# Patient Record
Sex: Female | Born: 1990 | Race: White | Hispanic: No | Marital: Single | Smoking: Current every day smoker
Health system: Southern US, Community
[De-identification: ages and names within clinical notes are randomized; demographics above are authoritative.]

## PROBLEM LIST (undated history)

## (undated) ENCOUNTER — Inpatient Hospital Stay (HOSPITAL_COMMUNITY): Payer: Self-pay

## (undated) DIAGNOSIS — R87629 Unspecified abnormal cytological findings in specimens from vagina: Secondary | ICD-10-CM

## (undated) DIAGNOSIS — F1910 Other psychoactive substance abuse, uncomplicated: Secondary | ICD-10-CM

## (undated) DIAGNOSIS — F259 Schizoaffective disorder, unspecified: Secondary | ICD-10-CM

## (undated) DIAGNOSIS — M419 Scoliosis, unspecified: Secondary | ICD-10-CM

## (undated) DIAGNOSIS — A6000 Herpesviral infection of urogenital system, unspecified: Secondary | ICD-10-CM

## (undated) DIAGNOSIS — E079 Disorder of thyroid, unspecified: Secondary | ICD-10-CM

## (undated) DIAGNOSIS — R87619 Unspecified abnormal cytological findings in specimens from cervix uteri: Secondary | ICD-10-CM

## (undated) DIAGNOSIS — B999 Unspecified infectious disease: Secondary | ICD-10-CM

## (undated) DIAGNOSIS — F309 Manic episode, unspecified: Secondary | ICD-10-CM

## (undated) DIAGNOSIS — F32A Depression, unspecified: Secondary | ICD-10-CM

## (undated) DIAGNOSIS — F4310 Post-traumatic stress disorder, unspecified: Secondary | ICD-10-CM

## (undated) DIAGNOSIS — F329 Major depressive disorder, single episode, unspecified: Secondary | ICD-10-CM

## (undated) DIAGNOSIS — Z9109 Other allergy status, other than to drugs and biological substances: Secondary | ICD-10-CM

## (undated) DIAGNOSIS — F250 Schizoaffective disorder, bipolar type: Secondary | ICD-10-CM

## (undated) DIAGNOSIS — F319 Bipolar disorder, unspecified: Secondary | ICD-10-CM

## (undated) DIAGNOSIS — D649 Anemia, unspecified: Secondary | ICD-10-CM

## (undated) DIAGNOSIS — F419 Anxiety disorder, unspecified: Secondary | ICD-10-CM

## (undated) DIAGNOSIS — IMO0002 Reserved for concepts with insufficient information to code with codable children: Secondary | ICD-10-CM

## (undated) DIAGNOSIS — K900 Celiac disease: Secondary | ICD-10-CM

## (undated) HISTORY — PX: COLPOSCOPY: SHX161

## (undated) HISTORY — DX: Manic episode, unspecified: F30.9

## (undated) HISTORY — DX: Disorder of thyroid, unspecified: E07.9

## (undated) HISTORY — DX: Bipolar disorder, unspecified: F31.9

## (undated) HISTORY — DX: Herpesviral infection of urogenital system, unspecified: A60.00

## (undated) HISTORY — PX: NO PAST SURGERIES: SHX2092

## (undated) HISTORY — DX: Anxiety disorder, unspecified: F41.9

---
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
## 2010-09-12 ENCOUNTER — Inpatient Hospital Stay (HOSPITAL_COMMUNITY)
Admission: AD | Admit: 2010-09-12 | Discharge: 2010-09-12 | Disposition: A | Discharge status: Home / Self Care | Payer: Medicaid Other | Source: Ambulatory Visit | Attending: Obstetrics and Gynecology | Admitting: Obstetrics and Gynecology

## 2010-09-12 DIAGNOSIS — O479 False labor, unspecified: Secondary | ICD-10-CM | POA: Insufficient documentation

## 2010-09-24 ENCOUNTER — Inpatient Hospital Stay (HOSPITAL_COMMUNITY)
Admission: AD | Admit: 2010-09-24 | Discharge: 2010-09-27 | DRG: 775 | Disposition: A | Discharge status: Home / Self Care | Payer: 59 | Source: Ambulatory Visit | Attending: Obstetrics and Gynecology | Admitting: Obstetrics and Gynecology

## 2010-09-24 DIAGNOSIS — O99334 Smoking (tobacco) complicating childbirth: Secondary | ICD-10-CM | POA: Diagnosis present

## 2010-09-25 ENCOUNTER — Inpatient Hospital Stay (HOSPITAL_COMMUNITY): Admission: AD | Admit: 2010-09-25 | Payer: Self-pay | Admitting: Obstetrics and Gynecology

## 2010-09-25 LAB — CBC
HCT: 36.4 % (ref 36.0–46.0)
MCH: 28 pg (ref 26.0–34.0)
MCV: 85.6 fL (ref 78.0–100.0)
RBC: 4.25 MIL/uL (ref 3.87–5.11)
RDW: 13.4 % (ref 11.5–15.5)
WBC: 13.5 10*3/uL — ABNORMAL HIGH (ref 4.0–10.5)

## 2010-09-26 LAB — CBC
HCT: 32.1 % — ABNORMAL LOW (ref 36.0–46.0)
MCH: 27.8 pg (ref 26.0–34.0)
MCV: 86.5 fL (ref 78.0–100.0)
RBC: 3.71 MIL/uL — ABNORMAL LOW (ref 3.87–5.11)
WBC: 18.3 10*3/uL — ABNORMAL HIGH (ref 4.0–10.5)

## 2010-10-17 NOTE — Discharge Summary (Signed)
  Monica, Patel                 ACCOUNT NO.:  [id number]  MEDICAL RECORD NO.:  [id number]           PATIENT TYPE:  I  LOCATION:  9107                          FACILITY:  [facility]  PHYSICIAN:  Zenaida Niece, M.D.DATE OF BIRTH:  [birth year]  DATE OF ADMISSION:  09/24/2010 DATE OF DISCHARGE:  09/27/2010                              DISCHARGE SUMMARY   ADMISSION DIAGNOSIS:  Intrauterine pregnancy at 39 weeks with ruptured membranes.  DISCHARGE DIAGNOSIS:  Intrauterine pregnancy at 39 weeks with ruptured membranes.  PROCEDURES:  On March 1, Dr. Ambrose Mantle performed a spontaneous vaginal delivery.  HISTORY AND PHYSICAL:  This is a 20 year old gravida 1, para 0 with an EGA of [redacted] weeks who presents with a complaint of possible leaking. Prenatal care essentially uncomplicated.  Significant prenatal labs are blood type is O positive with a negative antibody screen, rubella immune, first trimester screen normal, AFP is normal, GCT is 156, and GTT is normal.  Group B strep is negative.  Past medical history is significant for depression and scoliosis.  PHYSICAL EXAMINATION:  VITAL SIGNS:  She is afebrile with stable vital signs.  Fetal heart tracing reactive. ABDOMEN:  Gravid, nontender.  Cervix is 1+, 50, -2, vertex presentation, and she was eventually confirmed to have ruptured membranes after walking.  HOSPITAL COURSE:  The patient was admitted and put on Pitocin.  She eventually progressed into active labor, reached complete, pushed well, and on March 1 Dr. Ambrose Mantle performed spontaneous vaginal delivery of a viable female infant with Apgars of 9 and 9, weight 8 pounds 5 ounces. Placenta delivered spontaneously intact, uterus palpated normal, second- degree laceration repaired with 3-0 Vicryl, and estimated blood loss was 400 mL.  Postpartum, she had no significant complications.  Pre-delivery hemoglobin 11.9, post-delivery 10.3.  On postpartum day #2, she was felt to be stable enough  for discharge home.  DISCHARGE INSTRUCTIONS:  Regular diet, pelvic rest, follow up in 6 weeks.  MEDICATIONS:  Percocet #20 one to two p.o. q.4-6 h. p.r.n. pain and over- the-counter ibuprofen as needed.  She is given our discharge pamphlet.     Zenaida Niece, M.D.     TDM/MEDQ  D:  09/27/2010  T:  09/28/2010  Job:  [id number]  Electronically Signed by Lavina Hamman M.D. on 10/17/2010 08:52:59 AM

## 2011-04-17 LAB — DIFFERENTIAL
Basophils Relative: 0
Eosinophils Absolute: 0.1
Eosinophils Relative: 4
Monocytes Absolute: 0.3
Monocytes Relative: 10

## 2011-04-17 LAB — CBC
HCT: 37.5
Hemoglobin: 12.8
MCHC: 34.1
MCV: 83
RBC: 4.52
RDW: 13.3

## 2012-06-03 ENCOUNTER — Encounter (HOSPITAL_COMMUNITY): Payer: Self-pay | Admitting: *Deleted

## 2012-06-03 ENCOUNTER — Inpatient Hospital Stay (HOSPITAL_COMMUNITY): Payer: 59

## 2012-06-03 ENCOUNTER — Inpatient Hospital Stay (HOSPITAL_COMMUNITY)
Admission: AD | Admit: 2012-06-03 | Discharge: 2012-06-03 | Disposition: A | Discharge status: Home / Self Care | Payer: 59 | Source: Ambulatory Visit | Attending: Obstetrics and Gynecology | Admitting: Obstetrics and Gynecology

## 2012-06-03 DIAGNOSIS — O034 Incomplete spontaneous abortion without complication: Secondary | ICD-10-CM | POA: Insufficient documentation

## 2012-06-03 DIAGNOSIS — O039 Complete or unspecified spontaneous abortion without complication: Secondary | ICD-10-CM

## 2012-06-03 HISTORY — DX: Unspecified abnormal cytological findings in specimens from cervix uteri: R87.619

## 2012-06-03 HISTORY — DX: Reserved for concepts with insufficient information to code with codable children: IMO0002

## 2012-06-03 HISTORY — DX: Depression, unspecified: F32.A

## 2012-06-03 HISTORY — DX: Anemia, unspecified: D64.9

## 2012-06-03 HISTORY — DX: Unspecified infectious disease: B99.9

## 2012-06-03 HISTORY — DX: Major depressive disorder, single episode, unspecified: F32.9

## 2012-06-03 LAB — CBC
Hemoglobin: 11.6 g/dL — ABNORMAL LOW (ref 12.0–15.0)
MCH: 28.6 pg (ref 26.0–34.0)
MCHC: 33.7 g/dL (ref 30.0–36.0)
Platelets: 193 10*3/uL (ref 150–400)
RDW: 12.8 % (ref 11.5–15.5)

## 2012-06-03 MED ORDER — KETOROLAC TROMETHAMINE 60 MG/2ML IM SOLN
60.0000 mg | Freq: Once | INTRAMUSCULAR | Status: AC
  Administered 2012-06-03: 60 mg via INTRAMUSCULAR
  Filled 2012-06-03: qty 2

## 2012-06-03 NOTE — MAU Provider Note (Signed)
History     CSN: [id number]  Arrival date and time: 06/03/12 1426   First Provider Initiated Contact with Patient 06/03/12 1520      No chief complaint on file.  HPI  Pt dx with failed pregnancy and given Cytotec.  Reports bleeding started 5 days later on a Saturday.  Bleeding has continued, using approximately 4-5 pads a day.  Also reports midpelvic pain not relieved with Vicodin.  State can hardly stand or walk due to pain.     Past Medical History  Diagnosis Date  . Anemia   . Infection     UTI  . Abnormal Pap smear     colpo  . Depression     Past Surgical History  Procedure Date  . No past surgeries     Family History  Problem Relation Age of Onset  . Other Neg Hx     History  Substance Use Topics  . Smoking status: Current Every Day Smoker -- 0.5 packs/day for 6 years    Types: Cigarettes  . Smokeless tobacco: Never Used  . Alcohol Use: Yes     Comment: rare, not recently    Allergies: No Known Allergies  Prescriptions prior to admission  Medication Sig Dispense Refill  . HYDROcodone-acetaminophen (VICODIN) 5-500 MG per tablet Take 1 tablet by mouth every 6 (six) hours as needed. pain      . ibuprofen (ADVIL,MOTRIN) 800 MG tablet Take 800 mg by mouth every 8 (eight) hours as needed. pain        Review of Systems  Gastrointestinal: Positive for abdominal pain.  Genitourinary:       Vaginal pain   Physical Exam   Blood pressure 109/58, pulse 116, temperature 98.2 F (36.8 C), temperature source Oral, resp. rate 20, height 5\' 6"  (1.676 m), weight 53.978 kg (119 lb), last menstrual period 02/21/2012, unknown if currently breastfeeding.  Physical Exam  Constitutional: She is oriented to person, place, and time. She appears well-developed and well-nourished. No distress.       Uncomfortable appearing  HENT:  Head: Normocephalic.  Neck: Normal range of motion. Neck supple.  Cardiovascular: Normal rate, regular rhythm and normal heart sounds.  Exam  reveals no gallop and no friction rub.   No murmur heard. Respiratory: Effort normal and breath sounds normal. No respiratory distress.  GI: Soft. She exhibits no mass. There is tenderness (suprapubic). There is no rebound and no guarding.  Genitourinary: There is bleeding (moderate; +clots) around the vagina.  Neurological: She is alert and oriented to person, place, and time.  Skin: Skin is warm and dry.    MAU Course  Procedures  Results for orders placed during the hospital encounter of 06/03/12 (from the past 24 hour(s))  CBC     Status: Abnormal   Collection Time   06/03/12  3:33 PM      Component Value Range   WBC 13.6 (*) 4.0 - 10.5 K/uL   RBC 4.06  3.87 - 5.11 MIL/uL   Hemoglobin 11.6 (*) 12.0 - 15.0 g/dL   HCT 56.2 (*) 13.0 - 86.5 %   MCV 84.7  78.0 - 100.0 fL   MCH 28.6  26.0 - 34.0 pg   MCHC 33.7  30.0 - 36.0 g/dL   RDW 78.4  69.6 - 29.5 %   Platelets 193  150 - 400 K/uL     Reports improvement with pain with Toradol.  Consulted with Dr. Ambrose Mantle > reviewed HPI/exam/labs and ultrasound > DC home  with bleeding precautions.   Assessment and Plan  Vaginal Bleeding - Incomplete Miscarriage  Plan: DC to home. Bleeding Precautions.  [facility] 06/03/2012, 3:21 PM

## 2012-06-03 NOTE — MAU Note (Signed)
Was dx with failed preg, was given pills, has miscarriage early Sunday morning. Pain is worse today than it has been. Has tried vicodin, did not help.  Can hardly stand or walk due to pain.

## 2012-06-04 ENCOUNTER — Inpatient Hospital Stay (HOSPITAL_COMMUNITY): Payer: 59 | Admitting: Anesthesiology

## 2012-06-04 ENCOUNTER — Encounter (HOSPITAL_COMMUNITY): Payer: Self-pay | Admitting: Anesthesiology

## 2012-06-04 ENCOUNTER — Ambulatory Visit (HOSPITAL_COMMUNITY)
Admission: AD | Admit: 2012-06-04 | Discharge: 2012-06-04 | Disposition: A | Discharge status: Home / Self Care | Payer: 59 | Source: Ambulatory Visit | Attending: Obstetrics and Gynecology | Admitting: Obstetrics and Gynecology

## 2012-06-04 ENCOUNTER — Ambulatory Visit: Admit: 2012-06-04 | Payer: Self-pay | Admitting: Obstetrics and Gynecology

## 2012-06-04 ENCOUNTER — Encounter (HOSPITAL_COMMUNITY): Payer: Self-pay | Admitting: Obstetrics and Gynecology

## 2012-06-04 ENCOUNTER — Encounter (HOSPITAL_COMMUNITY)
Admission: AD | Disposition: A | Discharge status: Home / Self Care | Payer: Self-pay | Source: Ambulatory Visit | Attending: Obstetrics and Gynecology

## 2012-06-04 DIAGNOSIS — I951 Orthostatic hypotension: Secondary | ICD-10-CM | POA: Insufficient documentation

## 2012-06-04 DIAGNOSIS — O031 Delayed or excessive hemorrhage following incomplete spontaneous abortion: Secondary | ICD-10-CM | POA: Insufficient documentation

## 2012-06-04 HISTORY — PX: DILATION AND EVACUATION: SHX1459

## 2012-06-04 LAB — TYPE AND SCREEN: ABO/RH(D): O POS

## 2012-06-04 LAB — CBC
MCV: 84.6 fL (ref 78.0–100.0)
Platelets: 202 10*3/uL (ref 150–400)
RDW: 12.7 % (ref 11.5–15.5)
WBC: 16 10*3/uL — ABNORMAL HIGH (ref 4.0–10.5)

## 2012-06-04 SURGERY — DILATION AND EVACUATION, UTERUS
Anesthesia: General | Site: Vagina | Wound class: Clean Contaminated

## 2012-06-04 MED ORDER — MIDAZOLAM HCL 5 MG/5ML IJ SOLN
INTRAMUSCULAR | Status: DC | PRN
  Administered 2012-06-04: 1 mg via INTRAVENOUS

## 2012-06-04 MED ORDER — MIDAZOLAM HCL 2 MG/2ML IJ SOLN: 0.5000 mg | Freq: Once | INTRAMUSCULAR | Status: DC | PRN

## 2012-06-04 MED ORDER — LIDOCAINE HCL 1 % IJ SOLN: INTRAMUSCULAR | Status: DC | PRN

## 2012-06-04 MED ORDER — KETOROLAC TROMETHAMINE 30 MG/ML IJ SOLN
INTRAMUSCULAR | Status: DC | PRN
  Administered 2012-06-04: 30 mg via INTRAVENOUS

## 2012-06-04 MED ORDER — LACTATED RINGERS IV SOLN
INTRAVENOUS | Status: DC
  Administered 2012-06-04: 1000 mL via INTRAVENOUS
  Administered 2012-06-04: 250 mL/h via INTRAVENOUS

## 2012-06-04 MED ORDER — SODIUM CHLORIDE 0.9 % IR SOLN
Status: DC | PRN
  Administered 2012-06-04: 1000 mL

## 2012-06-04 MED ORDER — PHENYLEPHRINE HCL 10 MG/ML IJ SOLN
INTRAMUSCULAR | Status: DC | PRN
  Administered 2012-06-04: 0.1 mg via INTRAVENOUS
  Administered 2012-06-04: .04 mg via INTRAVENOUS
  Administered 2012-06-04: 0.2 mg via INTRAVENOUS
  Administered 2012-06-04 (×2): 0.1 mg via INTRAVENOUS
  Administered 2012-06-04: .08 mg via INTRAVENOUS
  Administered 2012-06-04: 0.1 mg via INTRAVENOUS

## 2012-06-04 MED ORDER — CITRIC ACID-SODIUM CITRATE 334-500 MG/5ML PO SOLN
ORAL | Status: AC
  Filled 2012-06-04: qty 15

## 2012-06-04 MED ORDER — KETOROLAC TROMETHAMINE 30 MG/ML IJ SOLN: 15.0000 mg | Freq: Once | INTRAMUSCULAR | Status: DC | PRN

## 2012-06-04 MED ORDER — FENTANYL CITRATE 0.05 MG/ML IJ SOLN
INTRAMUSCULAR | Status: AC
  Filled 2012-06-04: qty 2

## 2012-06-04 MED ORDER — MEPERIDINE HCL 25 MG/ML IJ SOLN: 6.2500 mg | INTRAMUSCULAR | Status: DC | PRN

## 2012-06-04 MED ORDER — PROMETHAZINE HCL 25 MG/ML IJ SOLN: 6.2500 mg | INTRAMUSCULAR | Status: DC | PRN

## 2012-06-04 MED ORDER — DEXAMETHASONE SODIUM PHOSPHATE 4 MG/ML IJ SOLN
INTRAMUSCULAR | Status: DC | PRN
  Administered 2012-06-04: 5 mg via INTRAVENOUS

## 2012-06-04 MED ORDER — LACTATED RINGERS IV SOLN
INTRAVENOUS | Status: DC | PRN
  Administered 2012-06-04 (×2): via INTRAVENOUS

## 2012-06-04 MED ORDER — OXYTOCIN 10 UNIT/ML IJ SOLN
40.0000 [IU] | INTRAVENOUS | Status: DC | PRN
  Administered 2012-06-04: 20 [IU] via INTRAVENOUS

## 2012-06-04 MED ORDER — FENTANYL CITRATE 0.05 MG/ML IJ SOLN
25.0000 ug | INTRAMUSCULAR | Status: DC | PRN
  Administered 2012-06-04: 25 ug via INTRAVENOUS

## 2012-06-04 MED ORDER — CEFAZOLIN SODIUM 1-5 GM-% IV SOLN
1.0000 g | Freq: Three times a day (TID) | INTRAVENOUS | Status: DC
  Filled 2012-06-04 (×4): qty 50

## 2012-06-04 MED ORDER — BUPIVACAINE HCL (PF) 0.75 % IJ SOLN
INTRAMUSCULAR | Status: DC | PRN
  Administered 2012-06-04: 1.5 mL

## 2012-06-04 MED ORDER — CEFAZOLIN SODIUM 1-5 GM-% IV SOLN
INTRAVENOUS | Status: DC | PRN
  Administered 2012-06-04: 1 g via INTRAVENOUS

## 2012-06-04 SURGICAL SUPPLY — 20 items
CATH ROBINSON RED A/P 16FR (CATHETERS) ×2 IMPLANT
CLOTH BEACON ORANGE TIMEOUT ST (SAFETY) ×2 IMPLANT
DECANTER SPIKE VIAL GLASS SM (MISCELLANEOUS) ×2 IMPLANT
GLOVE BIO SURGEON STRL SZ7.5 (GLOVE) ×4 IMPLANT
GOWN PREVENTION PLUS XLARGE (GOWN DISPOSABLE) ×2 IMPLANT
GOWN STRL REIN XL XLG (GOWN DISPOSABLE) ×2 IMPLANT
KIT BERKELEY 1ST TRIMESTER 3/8 (MISCELLANEOUS) ×2 IMPLANT
NEEDLE SPNL 22GX3.5 QUINCKE BK (NEEDLE) ×2 IMPLANT
NS IRRIG 1000ML POUR BTL (IV SOLUTION) ×2 IMPLANT
PACK VAGINAL MINOR WOMEN LF (CUSTOM PROCEDURE TRAY) ×2 IMPLANT
PAD OB MATERNITY 4.3X12.25 (PERSONAL CARE ITEMS) ×2 IMPLANT
PAD PREP 24X48 CUFFED NSTRL (MISCELLANEOUS) ×2 IMPLANT
SET BERKELEY SUCTION TUBING (SUCTIONS) ×2 IMPLANT
SUT CHROMIC 2 0 CT 1 (SUTURE) ×2 IMPLANT
SYR CONTROL 10ML LL (SYRINGE) ×2 IMPLANT
TOWEL OR 17X24 6PK STRL BLUE (TOWEL DISPOSABLE) ×4 IMPLANT
VACURETTE 10 RIGID CVD (CANNULA) IMPLANT
VACURETTE 7MM CVD STRL WRAP (CANNULA) IMPLANT
VACURETTE 8 RIGID CVD (CANNULA) ×2 IMPLANT
VACURETTE 9 RIGID CVD (CANNULA) IMPLANT

## 2012-06-04 NOTE — Anesthesia Preprocedure Evaluation (Signed)
Anesthesia Evaluation  Patient identified by MRN, date of birth, ID band Patient awake    Reviewed: Allergy & Precautions, H&P , Patient's Chart, lab work & pertinent test results, reviewed documented beta blocker date and time   History of Anesthesia Complications Negative for: history of anesthetic complications  Airway Mallampati: II TM Distance: >3 FB Neck ROM: full    Dental No notable dental hx.    Pulmonary neg pulmonary ROS,  breath sounds clear to auscultation  Pulmonary exam normal       Cardiovascular Exercise Tolerance: Good negative cardio ROS  Rhythm:regular Rate:Normal     Neuro/Psych PSYCHIATRIC DISORDERS Depression negative neurological ROS  negative psych ROS   GI/Hepatic negative GI ROS, Neg liver ROS,   Endo/Other  negative endocrine ROS  Renal/GU negative Renal ROS     Musculoskeletal   Abdominal   Peds  Hematology negative hematology ROS (+)   Anesthesia Other Findings Orthostatic bleeding  Reproductive/Obstetrics negative OB ROS                           Anesthesia Physical Anesthesia Plan  ASA: II and emergent  Anesthesia Plan: General ETT   Post-op Pain Management:    Induction: Cricoid pressure planned, Rapid sequence and Intravenous  Airway Management Planned:   Additional Equipment:   Intra-op Plan:   Post-operative Plan:   Informed Consent: I have reviewed the patients History and Physical, chart, labs and discussed the procedure including the risks, benefits and alternatives for the proposed anesthesia with the patient or authorized representative who has indicated his/her understanding and acceptance.   Dental Advisory Given  Plan Discussed with: CRNA and Surgeon  Anesthesia Plan Comments:         Anesthesia Quick Evaluation

## 2012-06-04 NOTE — OR Nursing (Signed)
No lidocaine was used during the surgery.

## 2012-06-04 NOTE — H&P (Signed)
[name], [name] NO.:  [id number]  MEDICAL RECORD NO.:  [id number]  LOCATION:  OZ30                          FACILITY:  [facility]  PHYSICIAN:  Malachi Pro. Ambrose Mantle, M.D. DATE OF BIRTH:  [birth year]  DATE OF ADMISSION:  06/04/2012 DATE OF DISCHARGE:                             HISTORY & PHYSICAL   HISTORY OF PRESENT ILLNESS:  This is a 21 year old white single female para 1-0-0-1, gravida 2, last menstrual period February 21, 2012; however, ultrasound on May 25, 2012, showed an 8 week 5 day nonviable embryo. The patient was given Cytotec on May 28, 2012, and passed an embryo on May 29, 2012.  She did fine until June 03, 2012, when she began having a lot of abdominal pain and came to the emergency room at [facility], underwent an ultrasound that showed the embryo was gone, but there was some possible material in the uterus.  It was not clear that there was retained products.  She was discharged after some Toradol.  She came back to the hospital tonight complaining of severe pain and came by EMS.  Her temperature was 98.7, pulse is 108, respirations 18, blood pressure 103/58; however, when she sat up, her pulse went to 175, an her blood pressure dropped to 78/39.  Her hemoglobin, which had been 11.6 yesterday had dropped to 10.4.  The patient states that she has been bleeding heavily the last few hours and passing clots as large as a couple of silver dollars.  PAST MEDICAL HISTORY:  Reveals no known allergies.  No operations. Usual childhood diseases.  The patient does have a 76-month-old child. She does not drink alcohol or take drugs, but she does smoke a 1/2 pack of cigarettes a day.  FAMILY HISTORY:  Her mother is 54, living and well.  Father 95, living and well.  Two sisters living and well.  PHYSICAL EXAMINATION:  VITAL SIGNS:  Temperature 98.7, pulse 108, respirations 18, blood pressure 103/58. HEART:  Normal size and sounds.  No  murmurs. LUNGS:  Clear to auscultation. ABDOMEN:  Has definite increased tone in the lower abdomen.  It is impossible to tell whether it is actual structure in the lower abdomen that is above the pubis.  The patient cannot relax well.  On visual examination of the vulva, there is a blood clot present in the vagina that I removed with my finger.  The patient does not tolerate an exam well, but I cannot tell that the cervix is open.  ADMITTING IMPRESSION:  Probable incomplete abortion with hemorrhage and anemia with resulting postural hypotension.  The patient is admitted for suction D and C.  She understands the risks.  She has been given risks of perforation of the uterus, hemorrhage, need for operation in the abdomen, possible blood transfusion, and infection.  She understands and agrees to proceed.     Malachi Pro. Ambrose Mantle, M.D.     TFH/MEDQ  D:  06/04/2012  T:  06/04/2012  Job:  [id number]

## 2012-06-04 NOTE — Op Note (Signed)
Operative note on Monica Patel:  Date of the operation: 06/04/2012  Preoperative diagnosis: Incomplete abortion with hemorrhage resulting in postural hypotension  Postoperative diagnosis: Same  Operation: Suction D&C  Operator: Ambrose Mantle  Anesthesia: Dr. Brayton Caves spinal  The patient was brought to the operating room and given a spinal anesthetic by Dr. Brayton Caves. She was placed in the lithotomy position. The vulva, vagina, and urethra were prepped with Betadine solution and the bladder was emptied with a [place] catheter. A time out was done. A weighted speculum was placed posteriorly in the vagina and I removed about 150 cc of formed clot from the vagina. A large amount of placental tissue that I thought measured 3 x 3 x 2" was removed from the vagina above the clot. The cervix was then grasped with a tenaculum on the anterior lip and ring forceps were placed into the uterus to remove any free tissue. No dilatation of the cervix was required. I inserted a #8 curved suction curet and curetted the endometrial cavity. After I got no return on the suction I used a sharp curet to ensure that the walls of the uterus felt smooth. I then removed the tenaculum and when pressure did not stop the bleeding from one of the sites I sutured it with 2-0 chromic. There was minimal blood loss during the procedure. All of her blood loss had been prior to coming to the hospital and what was removed from the vagina in the operating room. The patient was returned to recovery in satisfactory condition. Prior to leaving the operating room I massaged the uterus and it was quite firm and only upper limit of normal size

## 2012-06-04 NOTE — Anesthesia Procedure Notes (Signed)
Spinal  Patient location during procedure: OR Start time: 06/04/2012 5:59 PM Staffing Anesthesiologist: Brayton Caves R Performed by: anesthesiologist  Preanesthetic Checklist Completed: patient identified, site marked, surgical consent, pre-op evaluation, timeout performed, IV checked, risks and benefits discussed and monitors and equipment checked Spinal Block Patient position: sitting Prep: DuraPrep Patient monitoring: heart rate, cardiac monitor, continuous pulse ox and blood pressure Approach: midline Location: L3-4 Injection technique: single-shot Needle Needle type: Sprotte  Needle gauge: 24 G Needle length: 9 cm Assessment Sensory level: T4 Additional Notes Patient identified.  Risk benefits discussed including failed block, incomplete pain control, headache, nerve damage, paralysis, blood pressure changes, nausea, vomiting, reactions to medication both toxic or allergic, and postpartum back pain.  Patient expressed understanding and wished to proceed.  All questions were answered.  Sterile technique used throughout procedure.  CSF was clear.  No parasthesia or other complications.  Please see nursing notes for vital signs.

## 2012-06-04 NOTE — Transfer of Care (Signed)
Immediate Anesthesia Transfer of Care Note  Patient: Monica Patel  Procedure(s) Performed: Procedure(s) (LRB) with comments: DILATATION AND EVACUATION (N/A)  Patient Location: PACU  Anesthesia Type:Spinal  Level of Consciousness: awake, alert  and oriented  Airway & Oxygen Therapy: Patient Spontanous Breathing  Post-op Assessment: Report given to PACU RN and Post -op Vital signs reviewed and stable  Post vital signs: stable  Complications: No apparent anesthesia complications

## 2012-06-04 NOTE — Progress Notes (Signed)
Pt arrival by EMS.  EMS bedside report of Dilaudid 1 mg in route for pain of 10/10 per v.o. From Lilyan Punt, NP.

## 2012-06-04 NOTE — MAU Provider Note (Signed)
EMS called on the way to MAU.  Client in pain.  Dilaudid 1 mg IV ordered. Client arrived in MAU.  States pain relieved for a short time, but was now cramping again. On arrival to MAU, BP 101/62, pulse 110.  Sitting, BP 78/39 and pulse 175.  Client was very pale and returned to lying down position. Then BP 103/58 and pulse 108. Dr. Ambrose Mantle notified of client's condition and will come to see patient.

## 2012-06-04 NOTE — MAU Note (Signed)
"  I had a miscarriage on Sunday after taking Cytotec.  I started bleeding & severe cramping about 12:30pm today.  I have soaked 4 pads in the last 2-3 hours."

## 2012-06-04 NOTE — Anesthesia Postprocedure Evaluation (Signed)
Anesthesia Post Note  Patient: Monica Patel  Procedure(s) Performed: Procedure(s) (LRB): DILATATION AND EVACUATION (N/A)  Anesthesia type: Spinal  Patient location: PACU  Post pain: Pain level controlled  Post assessment: Post-op Vital signs reviewed  Last Vitals:  Filed Vitals:   06/04/12 2015  BP: 101/55  Pulse: 97  Temp: 36.1 C  Resp: 24    Post vital signs: Reviewed  Level of consciousness: awake  Complications: No apparent anesthesia complications

## 2012-06-06 ENCOUNTER — Encounter (HOSPITAL_COMMUNITY): Payer: Self-pay | Admitting: Obstetrics and Gynecology

## 2013-01-17 ENCOUNTER — Encounter (HOSPITAL_COMMUNITY): Payer: Self-pay | Admitting: Emergency Medicine

## 2013-01-17 ENCOUNTER — Emergency Department (HOSPITAL_COMMUNITY)
Admission: EM | Admit: 2013-01-17 | Discharge: 2013-01-18 | Disposition: A | Discharge status: Home / Self Care | Payer: 59 | Attending: Emergency Medicine | Admitting: Emergency Medicine

## 2013-01-17 DIAGNOSIS — F17200 Nicotine dependence, unspecified, uncomplicated: Secondary | ICD-10-CM | POA: Insufficient documentation

## 2013-01-17 DIAGNOSIS — Y92009 Unspecified place in unspecified non-institutional (private) residence as the place of occurrence of the external cause: Secondary | ICD-10-CM | POA: Insufficient documentation

## 2013-01-17 DIAGNOSIS — Z8659 Personal history of other mental and behavioral disorders: Secondary | ICD-10-CM | POA: Insufficient documentation

## 2013-01-17 DIAGNOSIS — T6391XA Toxic effect of contact with unspecified venomous animal, accidental (unintentional), initial encounter: Secondary | ICD-10-CM | POA: Insufficient documentation

## 2013-01-17 DIAGNOSIS — R209 Unspecified disturbances of skin sensation: Secondary | ICD-10-CM | POA: Insufficient documentation

## 2013-01-17 DIAGNOSIS — R0789 Other chest pain: Secondary | ICD-10-CM | POA: Insufficient documentation

## 2013-01-17 DIAGNOSIS — N390 Urinary tract infection, site not specified: Secondary | ICD-10-CM | POA: Insufficient documentation

## 2013-01-17 DIAGNOSIS — Z862 Personal history of diseases of the blood and blood-forming organs and certain disorders involving the immune mechanism: Secondary | ICD-10-CM | POA: Insufficient documentation

## 2013-01-17 DIAGNOSIS — T63001A Toxic effect of unspecified snake venom, accidental (unintentional), initial encounter: Secondary | ICD-10-CM | POA: Insufficient documentation

## 2013-01-17 DIAGNOSIS — Y9301 Activity, walking, marching and hiking: Secondary | ICD-10-CM | POA: Insufficient documentation

## 2013-01-17 NOTE — ED Notes (Signed)
PT. PRESENTS WITH SWELLING  AND  EDEMA AT LEFT FOOT/LEFT ANKLE , PT. REPORTED SNAKE BITE AT LEFT FOOT YESTERDAY EVENING , SEEN BY HER PCP ( DR. Rosezetta Schlatter)  DISCHARGED HOME WITH PRESCRIPTION VICODIN .

## 2013-01-18 LAB — COMPREHENSIVE METABOLIC PANEL
BUN: 10 mg/dL (ref 6–23)
CO2: 27 mEq/L (ref 19–32)
Calcium: 8.9 mg/dL (ref 8.4–10.5)
Chloride: 102 mEq/L (ref 96–112)
Creatinine, Ser: 0.73 mg/dL (ref 0.50–1.10)
GFR calc Af Amer: >90 mL/min (ref >90)
GFR calc non Af Amer: >90 mL/min (ref >90)
Total Bilirubin: 0.2 mg/dL — ABNORMAL LOW (ref 0.3–1.2)

## 2013-01-18 LAB — URINALYSIS, ROUTINE W REFLEX MICROSCOPIC
Ketones, ur: NEGATIVE mg/dL
Nitrite: POSITIVE — AB
Protein, ur: NEGATIVE mg/dL
Urobilinogen, UA: 1 mg/dL (ref 0.0–1.0)

## 2013-01-18 LAB — CBC WITH DIFFERENTIAL/PLATELET
Basophils Absolute: 0 10*3/uL (ref 0.0–0.1)
Basophils Relative: 0 % (ref 0–1)
Eosinophils Relative: 1 % (ref 0–5)
HCT: 37.7 % (ref 36.0–46.0)
MCHC: 33.4 g/dL (ref 30.0–36.0)
MCV: 83.6 fL (ref 78.0–100.0)
Monocytes Absolute: 0.7 10*3/uL (ref 0.1–1.0)
Neutro Abs: 7.9 10*3/uL — ABNORMAL HIGH (ref 1.7–7.7)
Platelets: 222 10*3/uL (ref 150–400)
RDW: 13.1 % (ref 11.5–15.5)
WBC: 11.2 10*3/uL — ABNORMAL HIGH (ref 4.0–10.5)

## 2013-01-18 LAB — PROTIME-INR: Prothrombin Time: 13 seconds (ref 11.6–15.2)

## 2013-01-18 LAB — LACTIC ACID, PLASMA: Lactic Acid, Venous: 0.4 mmol/L — ABNORMAL LOW (ref 0.5–2.2)

## 2013-01-18 LAB — URINE MICROSCOPIC-ADD ON

## 2013-01-18 MED ORDER — CEPHALEXIN 500 MG PO CAPS: 500.0000 mg | ORAL_CAPSULE | Freq: Three times a day (TID) | ORAL | Status: DC

## 2013-01-18 MED ORDER — CEPHALEXIN 250 MG PO CAPS
500.0000 mg | ORAL_CAPSULE | Freq: Three times a day (TID) | ORAL | Status: DC
  Administered 2013-01-18: 500 mg via ORAL
  Filled 2013-01-18: qty 2

## 2013-01-18 MED ORDER — ONDANSETRON HCL 4 MG/2ML IJ SOLN
4.0000 mg | Freq: Once | INTRAMUSCULAR | Status: AC
  Administered 2013-01-18: 4 mg via INTRAVENOUS
  Filled 2013-01-18: qty 2

## 2013-01-18 MED ORDER — MORPHINE SULFATE 4 MG/ML IJ SOLN
4.0000 mg | Freq: Once | INTRAMUSCULAR | Status: AC
  Administered 2013-01-18: 4 mg via INTRAVENOUS
  Filled 2013-01-18: qty 1

## 2013-01-18 NOTE — ED Provider Notes (Signed)
History    CSN: [id number] Arrival date & time 01/17/13  2327  First MD Initiated Contact with Patient 01/18/13 0010     Chief Complaint  Patient presents with  . Snake Bite   (Consider location/radiation/quality/duration/timing/severity/associated sxs/prior Treatment) HPI Patient believes that she sustained a snake bite to left ankle approx 27h ago, She was walking from her car to her house, bent down to pick something up and felt a bite to her distal left foot. She assumed that she sustained some type of insect bite. So, she went to sleep, as usual.   But, she awoke in the morning with severe pain and swelling of the left foot and ankle. Her sx have worsened throughout the day and swelling has progressed to proximal aspect of the left lower leg. She has also had intermittent paresthesias in her left hand and arm and fleeting episodes of chest discomfort.   Pain is 5/10 at this time. Pain is worse with any dorsiflexion of ankle or attempt to bear weight on left foot.   Of note, the patient was seen earlier in the day (actually yesterday June 24) by her PCP who advised that she was out of the window for antivenom treatment. He advised that she elevate the leg/foot and take Vicodin for pain. However, sx worsened and thus, patient decided to come to the ED.   Mother thinks that the patient is acting "a little strange". Says, "you are acting like you are really tired but, it's not that late. (It's actually 0030).  Past Medical History  Diagnosis Date  . Anemia   . Infection     UTI  . Abnormal Pap smear     colpo  . Depression    Past Surgical History  Procedure Laterality Date  . No past surgeries    . Dilation and evacuation  06/04/2012    Procedure: DILATATION AND EVACUATION;  Surgeon: Bing Plume, MD;  Location: [facility];  Service: Gynecology;  Laterality: N/A;   Family History  Problem Relation Age of Onset  . Other Neg Hx    History  Substance Use Topics  . Smoking  status: Current Every Day Smoker -- 0.50 packs/day for 6 years    Types: Cigarettes  . Smokeless tobacco: Never Used  . Alcohol Use: Yes     Comment: rare, not recently   OB History   Grav Para Term Preterm Abortions TAB SAB Ect Mult Living   2 1 1  0 0 0 0 0 0 1     Review of Systems Gen: no weight loss, fevers, chills, night sweats Eyes: no discharge or drainage, no occular pain or visual changes Nose: no epistaxis or rhinorrhea Mouth: no dental pain, no sore throat Neck: no neck pain Lungs: no SOB, cough, wheezing CV: no chest pain, palpitations, dependent edema or orthopnea Abd: no abdominal pain, nausea, vomiting GU: no dysuria or gross hematuria MSK: As per history of present illness, otherwise negative Neuro: As per history of present illness, otherwise negative Skin: As per history of present illness, otherwise negative Psyche: negative.  Allergies  Review of patient's allergies indicates no known allergies.  Home Medications   Current Outpatient Rx  Name  Route  Sig  Dispense  Refill  . HYDROcodone-acetaminophen (VICODIN) 5-500 MG per tablet   Oral   Take 1 tablet by mouth every 6 (six) hours as needed. pain         . ibuprofen (ADVIL,MOTRIN) 800 MG tablet   Oral  Take 800 mg by mouth every 8 (eight) hours as needed. pain          BP 98/57  Pulse 105  Temp(Src) 99 F (37.2 C) (Oral)  Resp 14  SpO2 99%  LMP 01/25/2012  Breastfeeding? Unknown Physical Exam Gen: well developed and well nourished appearing, does not appear in acute distress Head: NCAT Eyes: PERL, EOMI Nose: no epistaixis or rhinorrhea Mouth/throat: mucosa is moist and pink Neck: supple, no stridor Lungs: CTA B, no wheezing, rhonchi or rales CV: rapid and regular, 104 Abd: soft, notender, nondistended Back: no ttp, no cva ttp Skin: no rashese, wnl Ext: left foot is diffusely edematous with echymosis over the dorsal foot, the ankle is edematous as well, there is taught edema of the  lower leg without discoloration, no tenderness or edema at the knee or above, two tiny wounds consistent with puncture wounds over the anterolateral aspect of left foot. DP pulses intact, sensation intact to light touch, cap refill < 2s Neuro: CN ii-xii grossly intact, no focal deficits Psyche; normal affect,  calm and cooperative.   ED Course  Procedures (including critical care time)  Results for orders placed during the hospital encounter of 01/17/13 (from the past 48 hour(s))  CBC WITH DIFFERENTIAL     Status: Abnormal   Collection Time    01/18/13  1:13 AM      Result Value Range   WBC 11.2 (*) 4.0 - 10.5 K/uL   RBC 4.51  3.87 - 5.11 MIL/uL   Hemoglobin 12.6  12.0 - 15.0 g/dL   HCT 16.1  09.6 - 04.5 %   MCV 83.6  78.0 - 100.0 fL   MCH 27.9  26.0 - 34.0 pg   MCHC 33.4  30.0 - 36.0 g/dL   RDW 40.9  81.1 - 91.4 %   Platelets 222  150 - 400 K/uL   Neutrophils Relative % 70  43 - 77 %   Neutro Abs 7.9 (*) 1.7 - 7.7 K/uL   Lymphocytes Relative 22  12 - 46 %   Lymphs Abs 2.5  0.7 - 4.0 K/uL   Monocytes Relative 6  3 - 12 %   Monocytes Absolute 0.7  0.1 - 1.0 K/uL   Eosinophils Relative 1  0 - 5 %   Eosinophils Absolute 0.1  0.0 - 0.7 K/uL   Basophils Relative 0  0 - 1 %   Basophils Absolute 0.0  0.0 - 0.1 K/uL  PROTIME-INR     Status: None   Collection Time    01/18/13  1:13 AM      Result Value Range   Prothrombin Time 13.0  11.6 - 15.2 seconds   INR 0.99  0.00 - 1.49  APTT     Status: None   Collection Time    01/18/13  1:13 AM      Result Value Range   aPTT 34  24 - 37 seconds  FIBRINOGEN     Status: None   Collection Time    01/18/13  1:13 AM      Result Value Range   Fibrinogen 291  204 - 475 mg/dL  COMPREHENSIVE METABOLIC PANEL     Status: Abnormal   Collection Time    01/18/13  1:13 AM      Result Value Range   Sodium 135  135 - 145 mEq/L   Potassium 4.1  3.5 - 5.1 mEq/L   Chloride 102  96 - 112 mEq/L   CO2 27  19 - 32 mEq/L   Glucose, Bld 88  70 - 99 mg/dL    BUN 10  6 - 23 mg/dL   Creatinine, Ser 1.61  0.50 - 1.10 mg/dL   Calcium 8.9  8.4 - 09.6 mg/dL   Total Protein 6.5  6.0 - 8.3 g/dL   Albumin 3.9  3.5 - 5.2 g/dL   AST 12  0 - 37 U/L   ALT 9  0 - 35 U/L   Alkaline Phosphatase 73  39 - 117 U/L   Total Bilirubin 0.2 (*) 0.3 - 1.2 mg/dL   GFR calc non Af Amer >90  >90 mL/min   GFR calc Af Amer >90  >90 mL/min   Comment:            The eGFR has been calculated     using the CKD EPI equation.     This calculation has not been     validated in all clinical     situations.     eGFR's persistently     <90 mL/min signify     possible Chronic Kidney Disease.  LACTIC ACID, PLASMA     Status: Abnormal   Collection Time    01/18/13  1:42 AM      Result Value Range   Lactic Acid, Venous 0.4 (*) 0.5 - 2.2 mmol/L  URINALYSIS, ROUTINE W REFLEX MICROSCOPIC     Status: Abnormal   Collection Time    01/18/13  1:55 AM      Result Value Range   Color, Urine ORANGE (*) YELLOW   Comment: BIOCHEMICALS MAY BE AFFECTED BY COLOR   APPearance CLOUDY (*) CLEAR   Specific Gravity, Urine 1.022  1.005 - 1.030   pH 5.5  5.0 - 8.0   Glucose, UA NEGATIVE  NEGATIVE mg/dL   Hgb urine dipstick SMALL (*) NEGATIVE   Bilirubin Urine SMALL (*) NEGATIVE   Ketones, ur NEGATIVE  NEGATIVE mg/dL   Protein, ur NEGATIVE  NEGATIVE mg/dL   Urobilinogen, UA 1.0  0.0 - 1.0 mg/dL   Nitrite POSITIVE (*) NEGATIVE   Leukocytes, UA LARGE (*) NEGATIVE  URINE MICROSCOPIC-ADD ON     Status: Abnormal   Collection Time    01/18/13  1:55 AM      Result Value Range   Squamous Epithelial / LPF MANY (*) RARE   WBC, UA 11-20  <3 WBC/hpf   RBC / HPF 7-10  <3 RBC/hpf   Bacteria, UA FEW (*) RARE    MDM  0454 - re-exam. Discoloration and edema have improved substantially with several hours of elevation. Labs normal only for incidental finding of UTI. The patient is stable for discharge. I have counseled regarding the importance of really elevating the left foot and lower leg. The  patient has crutches and Vicodin which was prescribed earlier in the day by her PCP. She will f/u with him in 2d. Will give number for [facility] for outpatient phone follow up.   Brandt Loosen, MD 01/19/13 [phone]

## 2013-01-18 NOTE — ED Notes (Signed)
Poison Control called to check on pt.  Per pt's mother, bruising and swelling have reduced somewhat.  Will continue to monitor.

## 2013-01-20 LAB — URINE CULTURE: Colony Count: >=100000

## 2013-01-21 ENCOUNTER — Telehealth (HOSPITAL_COMMUNITY): Payer: Self-pay | Admitting: Emergency Medicine

## 2013-01-21 NOTE — ED Notes (Signed)
Post ED Visit - Positive Culture Follow-up  Culture report reviewed by antimicrobial stewardship pharmacist: []  Wes Dulaney, Pharm.D., BCPS []  Celedonio Miyamoto, Pharm.D., BCPS []  Georgina Pillion, [address].D., BCPS []  [place], [address].D., BCPS, AAHIVP []  Estella Husk, Pharm.D., BCPS, AAHIVP [x]  Laurence Slate, [address].D., BCPS  Positive urine culture Treated with Keflex, organism sensitive to the same and no further patient follow-up is required at this time.  Kylie A Holland 01/21/2013, 3:48 PM

## 2013-06-10 ENCOUNTER — Encounter (HOSPITAL_COMMUNITY): Payer: Self-pay | Admitting: Emergency Medicine

## 2013-06-10 ENCOUNTER — Emergency Department (INDEPENDENT_AMBULATORY_CARE_PROVIDER_SITE_OTHER): Payer: 59

## 2013-06-10 ENCOUNTER — Emergency Department (HOSPITAL_COMMUNITY)
Admission: EM | Admit: 2013-06-10 | Discharge: 2013-06-10 | Disposition: A | Discharge status: Home / Self Care | Payer: 59 | Source: Home / Self Care | Attending: Family Medicine | Admitting: Family Medicine

## 2013-06-10 DIAGNOSIS — J40 Bronchitis, not specified as acute or chronic: Secondary | ICD-10-CM

## 2013-06-10 MED ORDER — IPRATROPIUM BROMIDE 0.02 % IN SOLN
0.5000 mg | Freq: Once | RESPIRATORY_TRACT | Status: AC
  Administered 2013-06-10: 0.5 mg via RESPIRATORY_TRACT

## 2013-06-10 MED ORDER — ALBUTEROL SULFATE (5 MG/ML) 0.5% IN NEBU
INHALATION_SOLUTION | RESPIRATORY_TRACT | Status: AC
  Filled 2013-06-10: qty 1

## 2013-06-10 MED ORDER — ALBUTEROL SULFATE (5 MG/ML) 0.5% IN NEBU
5.0000 mg | INHALATION_SOLUTION | Freq: Once | RESPIRATORY_TRACT | Status: AC
  Administered 2013-06-10: 5 mg via RESPIRATORY_TRACT

## 2013-06-10 MED ORDER — ALBUTEROL SULFATE HFA 108 (90 BASE) MCG/ACT IN AERS: 2.0000 | INHALATION_SPRAY | Freq: Four times a day (QID) | RESPIRATORY_TRACT | Status: DC | PRN

## 2013-06-10 MED ORDER — PREDNISONE 50 MG PO TABS: 50.0000 mg | ORAL_TABLET | Freq: Every day | ORAL | Status: DC

## 2013-06-10 MED ORDER — IPRATROPIUM BROMIDE 0.02 % IN SOLN
RESPIRATORY_TRACT | Status: AC
  Filled 2013-06-10: qty 2.5

## 2013-06-10 MED ORDER — AMOXICILLIN 500 MG PO CAPS: 500.0000 mg | ORAL_CAPSULE | Freq: Three times a day (TID) | ORAL | Status: DC

## 2013-06-10 MED ORDER — GUAIFENESIN-CODEINE 100-10 MG/5ML PO SOLN: 5.0000 mL | Freq: Every evening | ORAL | Status: DC | PRN

## 2013-06-10 MED ORDER — SODIUM CHLORIDE 0.9 % IN NEBU
INHALATION_SOLUTION | RESPIRATORY_TRACT | Status: AC
  Filled 2013-06-10: qty 6

## 2013-06-10 NOTE — ED Notes (Signed)
Pt c/o persistent cold sxs Has been to her PCP last night and last week Given a Z-pack and cough syrup w/no little relief Sxs today include: dyspnea, SOB, dry cough, left flank pain, chest d/c due to cough, HA, runny nose, ST Denies: f/v/d Alert w/no signs of acute distress.

## 2013-06-10 NOTE — ED Provider Notes (Signed)
Monica Patel is a 22 y.o. female who presents to Urgent Care today for cough for one month. Patient has been seen by her primary care provider not 3 times. She was given azithromycin course initially with cough medication. 2 weeks later in followup she continued to cough and was given more cough medication. She was seen by her doctor yesterday when she notes continued cough and now some shortness of breath. No treatment was offered at that time. She notes productive cough as well as wheezing. She notes mild shortness of breath. She denies any fevers or chills nausea vomiting or diarrhea. She feels well otherwise. She denies any history of COPD or asthma.    Past Medical History  Diagnosis Date  . Anemia   . Infection     UTI  . Abnormal Pap smear     colpo  . Depression    History  Substance Use Topics  . Smoking status: Current Every Day Smoker -- 0.50 packs/day for 6 years    Types: Cigarettes  . Smokeless tobacco: Never Used  . Alcohol Use: Yes     Comment: rare, not recently   ROS as above Medications reviewed. No current facility-administered medications for this encounter.   Current Outpatient Prescriptions  Medication Sig Dispense Refill  . citalopram (CELEXA) 20 MG tablet Take 20 mg by mouth daily.      Marland Kitchen lamoTRIgine (LAMICTAL) 100 MG tablet Take 100 mg by mouth 2 (two) times daily.      Marland Kitchen levothyroxine (SYNTHROID, LEVOTHROID) 75 MCG tablet Take 75 mcg by mouth daily before breakfast.      . albuterol (PROVENTIL HFA;VENTOLIN HFA) 108 (90 BASE) MCG/ACT inhaler Inhale 2 puffs into the lungs every 6 (six) hours as needed for wheezing or shortness of breath.  1 Inhaler  2  . amoxicillin (AMOXIL) 500 MG capsule Take 1 capsule (500 mg total) by mouth 3 (three) times daily.  21 capsule  0  . guaiFENesin-codeine 100-10 MG/5ML syrup Take 5 mLs by mouth at bedtime as needed for cough.  120 mL  0  . predniSONE (DELTASONE) 50 MG tablet Take 1 tablet (50 mg total) by mouth daily.  5  tablet  0    Exam:  BP 113/75  Pulse 77  Temp(Src) 97.9 F (36.6 C) (Oral)  Resp 16  SpO2 97%  LMP 05/18/2013  Breastfeeding? No Gen: Well NAD HEENT: EOMI,  MMM Lungs: Normal work of breathing. Prolonged respiratory phase with some rhonchorous breath sounds Heart: RRR no MRG Abd: NABS, NT, ND Exts: Non edematous BL  LE, warm and well perfused.   Results for orders placed during the hospital encounter of 06/10/13 (from the past 24 hour(s))  POCT RAPID STREP A ([facility] ONLY)     Status: None   Collection Time    06/10/13  3:08 PM      Result Value Range   Streptococcus, Group A Screen (Direct) NEGATIVE  NEGATIVE   Dg Chest 2 View  06/10/2013   CLINICAL DATA:  Cough for 1 month.  EXAM: CHEST  2 VIEW  COMPARISON:  09/07/2007  FINDINGS: The heart size and mediastinal contours are within normal limits. Both lungs are clear. The visualized skeletal structures are unremarkable.  IMPRESSION: No active cardiopulmonary disease.   Electronically Signed   By: Amie Portland M.D.   On: 06/10/2013 15:10    Assessment and Plan: 22 y.o. female with bronchitis. Plan to treat with amoxicillin prednisone albuterol and codeine containing cough  medication. Followup with primary care provider Discussed warning signs or symptoms. Please see discharge instructions. Patient expresses understanding.      Rodolph Bong, MD 06/10/13 [phone]

## 2013-06-12 LAB — CULTURE, GROUP A STREP

## 2013-07-27 ENCOUNTER — Emergency Department (HOSPITAL_BASED_OUTPATIENT_CLINIC_OR_DEPARTMENT_OTHER)
Admission: EM | Admit: 2013-07-27 | Discharge: 2013-07-27 | Disposition: A | Discharge status: Home / Self Care | Payer: 59 | Attending: Emergency Medicine | Admitting: Emergency Medicine

## 2013-07-27 ENCOUNTER — Emergency Department (HOSPITAL_BASED_OUTPATIENT_CLINIC_OR_DEPARTMENT_OTHER): Payer: 59

## 2013-07-27 ENCOUNTER — Encounter (HOSPITAL_BASED_OUTPATIENT_CLINIC_OR_DEPARTMENT_OTHER): Payer: Self-pay | Admitting: Emergency Medicine

## 2013-07-27 DIAGNOSIS — IMO0002 Reserved for concepts with insufficient information to code with codable children: Secondary | ICD-10-CM | POA: Insufficient documentation

## 2013-07-27 DIAGNOSIS — R062 Wheezing: Secondary | ICD-10-CM | POA: Insufficient documentation

## 2013-07-27 DIAGNOSIS — Z79899 Other long term (current) drug therapy: Secondary | ICD-10-CM | POA: Insufficient documentation

## 2013-07-27 DIAGNOSIS — B9789 Other viral agents as the cause of diseases classified elsewhere: Secondary | ICD-10-CM | POA: Insufficient documentation

## 2013-07-27 DIAGNOSIS — F329 Major depressive disorder, single episode, unspecified: Secondary | ICD-10-CM | POA: Insufficient documentation

## 2013-07-27 DIAGNOSIS — IMO0001 Reserved for inherently not codable concepts without codable children: Secondary | ICD-10-CM | POA: Insufficient documentation

## 2013-07-27 DIAGNOSIS — N390 Urinary tract infection, site not specified: Secondary | ICD-10-CM | POA: Insufficient documentation

## 2013-07-27 DIAGNOSIS — Z3202 Encounter for pregnancy test, result negative: Secondary | ICD-10-CM | POA: Insufficient documentation

## 2013-07-27 DIAGNOSIS — F3289 Other specified depressive episodes: Secondary | ICD-10-CM | POA: Insufficient documentation

## 2013-07-27 DIAGNOSIS — B349 Viral infection, unspecified: Secondary | ICD-10-CM

## 2013-07-27 DIAGNOSIS — Z862 Personal history of diseases of the blood and blood-forming organs and certain disorders involving the immune mechanism: Secondary | ICD-10-CM | POA: Insufficient documentation

## 2013-07-27 DIAGNOSIS — F17200 Nicotine dependence, unspecified, uncomplicated: Secondary | ICD-10-CM | POA: Insufficient documentation

## 2013-07-27 LAB — URINALYSIS, ROUTINE W REFLEX MICROSCOPIC
Glucose, UA: NEGATIVE mg/dL
KETONES UR: 40 mg/dL — AB
NITRITE: POSITIVE — AB
Protein, ur: >300 mg/dL — AB
SPECIFIC GRAVITY, URINE: 1.042 — AB (ref 1.005–1.030)
pH: 5 (ref 5.0–8.0)

## 2013-07-27 LAB — URINE MICROSCOPIC-ADD ON

## 2013-07-27 LAB — PREGNANCY, URINE: Preg Test, Ur: NEGATIVE

## 2013-07-27 MED ORDER — ALBUTEROL SULFATE HFA 108 (90 BASE) MCG/ACT IN AERS
2.0000 | INHALATION_SPRAY | Freq: Once | RESPIRATORY_TRACT | Status: AC
  Administered 2013-07-27: 2 via RESPIRATORY_TRACT
  Filled 2013-07-27: qty 6.7

## 2013-07-27 MED ORDER — CIPROFLOXACIN HCL 500 MG PO TABS: 500.0000 mg | ORAL_TABLET | Freq: Two times a day (BID) | ORAL | Status: DC

## 2013-07-27 NOTE — ED Notes (Signed)
Family at bedside. 

## 2013-07-27 NOTE — ED Notes (Signed)
Cough x 4 days. States she has dysuria. Has been taking AZO.

## 2013-07-27 NOTE — ED Notes (Signed)
Pt. Reports having a thyroid disease and takes medication Synthroid .  Pt. Med was increased to approx. ago.

## 2013-07-27 NOTE — ED Provider Notes (Signed)
CSN: [id number]     Arrival date & time 07/27/13  1529 History   First MD Initiated Contact with Patient 07/27/13 1717     Chief Complaint  Patient presents with  . Cough   (Consider location/radiation/quality/duration/timing/severity/associated sxs/prior Treatment) HPI Comments: Patient had a five-day history of cough with runny nose and myalgias. She's had chills but no known fevers. She denies any shortness of breath. There's been no nausea or vomiting or diarrhea. She's also having some increased urination and burning on urination. She's been taking over-the-counter cold medicines with no significant improvement.  Patient is a 23 y.o. female presenting with cough.  Cough Associated symptoms: chills, myalgias, rhinorrhea and wheezing   Associated symptoms: no chest pain, no diaphoresis, no fever, no headaches, no rash and no shortness of breath     Past Medical History  Diagnosis Date  . Anemia   . Infection     UTI  . Abnormal Pap smear     colpo  . Depression    Past Surgical History  Procedure Laterality Date  . No past surgeries    . Dilation and evacuation  06/04/2012    Procedure: DILATATION AND EVACUATION;  Surgeon: Bing Plumehomas F Henley, MD;  Location: [facility];  Service: Gynecology;  Laterality: N/A;   Family History  Problem Relation Age of Onset  . Other Neg Hx    History  Substance Use Topics  . Smoking status: Current Every Day Smoker -- 0.50 packs/day for 6 years    Types: Cigarettes  . Smokeless tobacco: Never Used  . Alcohol Use: Yes     Comment: rare, not recently   OB History   Grav Para Term Preterm Abortions TAB SAB Ect Mult Living   2 1 1  0 0 0 0 0 0 1     Review of Systems  Constitutional: Positive for chills and fatigue. Negative for fever and diaphoresis.  HENT: Positive for congestion, rhinorrhea and sneezing.   Eyes: Negative.   Respiratory: Positive for cough and wheezing. Negative for chest tightness and shortness of breath.   Cardiovascular:  Negative for chest pain and leg swelling.  Gastrointestinal: Negative for nausea, vomiting, abdominal pain, diarrhea and blood in stool.  Genitourinary: Positive for dysuria and frequency. Negative for hematuria, flank pain and difficulty urinating.  Musculoskeletal: Positive for myalgias. Negative for arthralgias and back pain.  Skin: Negative for rash.  Neurological: Negative for dizziness, speech difficulty, weakness, numbness and headaches.    Allergies  Review of patient's allergies indicates no known allergies.  Home Medications   Current Outpatient Rx  Name  Route  Sig  Dispense  Refill  . albuterol (PROVENTIL HFA;VENTOLIN HFA) 108 (90 BASE) MCG/ACT inhaler   Inhalation   Inhale 2 puffs into the lungs every 6 (six) hours as needed for wheezing or shortness of breath.   1 Inhaler   2   . amoxicillin (AMOXIL) 500 MG capsule   Oral   Take 1 capsule (500 mg total) by mouth 3 (three) times daily.   21 capsule   0   . ciprofloxacin (CIPRO) 500 MG tablet   Oral   Take 1 tablet (500 mg total) by mouth 2 (two) times daily. One po bid x 7 days   14 tablet   0   . citalopram (CELEXA) 20 MG tablet   Oral   Take 20 mg by mouth daily.         Marland Kitchen. guaiFENesin-codeine 100-10 MG/5ML syrup   Oral  Take 5 mLs by mouth at bedtime as needed for cough.   120 mL   0   . lamoTRIgine (LAMICTAL) 100 MG tablet   Oral   Take 100 mg by mouth 2 (two) times daily.         Marland Kitchen levothyroxine (SYNTHROID, LEVOTHROID) 75 MCG tablet   Oral   Take 75 mcg by mouth daily before breakfast.         . predniSONE (DELTASONE) 50 MG tablet   Oral   Take 1 tablet (50 mg total) by mouth daily.   5 tablet   0    BP 99/67  Pulse 94  Temp(Src) 98.4 F (36.9 C) (Oral)  Resp 20  Ht 5\' 6"  (1.676 m)  Wt 134 lb (60.782 kg)  BMI 21.64 kg/m2  SpO2 97%  LMP 07/26/2013 Physical Exam  Constitutional: She is oriented to person, place, and time. She appears well-developed and well-nourished.  HENT:   Head: Normocephalic and atraumatic.  Eyes: Pupils are equal, round, and reactive to light.  Neck: Normal range of motion. Neck supple.  Cardiovascular: Normal rate, regular rhythm and normal heart sounds.   Pulmonary/Chest: Effort normal. No respiratory distress. She has wheezes (mild wheezing in the bases bilaterally. No increased work of breathing). She has no rales. She exhibits no tenderness.  Abdominal: Soft. Bowel sounds are normal. There is no tenderness. There is no rebound and no guarding.  Musculoskeletal: Normal range of motion. She exhibits no edema.  Lymphadenopathy:    She has no cervical adenopathy.  Neurological: She is alert and oriented to person, place, and time.  Skin: Skin is warm and dry. No rash noted.  Psychiatric: She has a normal mood and affect.    ED Course  Procedures (including critical care time) Labs Review Labs Reviewed  URINALYSIS, ROUTINE W REFLEX MICROSCOPIC - Abnormal; Notable for the following:    Color, Urine RED (*)    APPearance CLOUDY (*)    Specific Gravity, Urine 1.042 (*)    Hgb urine dipstick TRACE (*)    Bilirubin Urine LARGE (*)    Ketones, ur 40 (*)    Protein, ur >300 (*)    Urobilinogen, UA >8.0 (*)    Nitrite POSITIVE (*)    Leukocytes, UA LARGE (*)    All other components within normal limits  URINE MICROSCOPIC-ADD ON - Abnormal; Notable for the following:    Squamous Epithelial / LPF FEW (*)    Bacteria, UA FEW (*)    All other components within normal limits  URINE CULTURE  PREGNANCY, URINE   Imaging Review Dg Chest 2 View  07/27/2013   CLINICAL DATA:  Cough for 4 days  EXAM: CHEST  2 VIEW  COMPARISON:  06/16/2013  FINDINGS: The heart size and mediastinal contours are within normal limits. Both lungs are clear. The visualized skeletal structures are unremarkable.  IMPRESSION: No active cardiopulmonary disease.   Electronically Signed   By: Esperanza Heir M.D.   On: 07/27/2013 18:11    EKG Interpretation   None        MDM   1. Viral syndrome   2. UTI (lower urinary tract infection)    Patient is well-appearing with flulike symptoms for the last 5 days. She's out of the window for Tamiflu. She has no hypoxia or shortness of breath. She had a little bit of wheezing on exam and was given an albuterol inhaler to use which she has used in the past with respiratory infections.  There is no evidence of pneumonia. She was given an antibiotic for her urinary tract infection. She was encouraged to followup with her primary care physician if her symptoms are not improving.    Rolan Bucco, MD 07/27/13 [phone]

## 2013-07-27 NOTE — ED Notes (Signed)
Pt. Reports taking Motrin and other OTC med.

## 2013-07-27 NOTE — Discharge Instructions (Signed)
Urinary Tract Infection Urinary tract infections (UTIs) can develop anywhere along your urinary tract. Your urinary tract is your body's drainage system for removing wastes and extra water. Your urinary tract includes two kidneys, two ureters, a bladder, and a urethra. Your kidneys are a pair of bean-shaped organs. Each kidney is about the size of your fist. They are located below your ribs, one on each side of your spine. CAUSES Infections are caused by microbes, which are microscopic organisms, including fungi, viruses, and bacteria. These organisms are so small that they can only be seen through a microscope. Bacteria are the microbes that most commonly cause UTIs. SYMPTOMS  Symptoms of UTIs may vary by age and gender of the patient and by the location of the infection. Symptoms in young women typically include a frequent and intense urge to urinate and a painful, burning feeling in the bladder or urethra during urination. Older women and men are more likely to be tired, shaky, and weak and have muscle aches and abdominal pain. A fever may mean the infection is in your kidneys. Other symptoms of a kidney infection include pain in your back or sides below the ribs, nausea, and vomiting. DIAGNOSIS To diagnose a UTI, your caregiver will ask you about your symptoms. Your caregiver also will ask to provide a urine sample. The urine sample will be tested for bacteria and white blood cells. White blood cells are made by your body to help fight infection. TREATMENT  Typically, UTIs can be treated with medication. Because most UTIs are caused by a bacterial infection, they usually can be treated with the use of antibiotics. The choice of antibiotic and length of treatment depend on your symptoms and the type of bacteria causing your infection. HOME CARE INSTRUCTIONS  If you were prescribed antibiotics, take them exactly as your caregiver instructs you. Finish the medication even if you feel better after you  have only taken some of the medication.  Drink enough water and fluids to keep your urine clear or pale yellow.  Avoid caffeine, tea, and carbonated beverages. They tend to irritate your bladder.  Empty your bladder often. Avoid holding urine for long periods of time.  Empty your bladder before and after sexual intercourse.  After a bowel movement, women should cleanse from front to back. Use each tissue only once. SEEK MEDICAL CARE IF:   You have back pain.  You develop a fever.  Your symptoms do not begin to resolve within 3 days. SEEK IMMEDIATE MEDICAL CARE IF:   You have severe back pain or lower abdominal pain.  You develop chills.  You have nausea or vomiting.  You have continued burning or discomfort with urination. MAKE SURE YOU:   Understand these instructions.  Will watch your condition.  Will get help right away if you are not doing well or get worse. Document Released: 04/22/2005 Document Revised: 01/12/2012 Document Reviewed: 08/21/2011 [facility] Patient Information 2014 [place], [place].  Viral Infections A viral infection can be caused by different types of viruses.Most viral infections are not serious and resolve on their own. However, some infections may cause severe symptoms and may lead to further complications. SYMPTOMS Viruses can frequently cause:  Minor sore throat.  Aches and pains.  Headaches.  Runny nose.  Different types of rashes.  Watery eyes.  Tiredness.  Cough.  Loss of appetite.  Gastrointestinal infections, resulting in nausea, vomiting, and diarrhea. These symptoms do not respond to antibiotics because the infection is not caused by bacteria. However,  you might catch a bacterial infection following the viral infection. This is sometimes called a "superinfection." Symptoms of such a bacterial infection may include:  Worsening sore throat with pus and difficulty swallowing.  Swollen neck glands.  Chills and a high or  persistent fever.  Severe headache.  Tenderness over the sinuses.  Persistent overall ill feeling (malaise), muscle aches, and tiredness (fatigue).  Persistent cough.  Yellow, green, or brown mucus production with coughing. HOME CARE INSTRUCTIONS   Only take over-the-counter or prescription medicines for pain, discomfort, diarrhea, or fever as directed by your caregiver.  Drink enough water and fluids to keep your urine clear or pale yellow. Sports drinks can provide valuable electrolytes, sugars, and hydration.  Get plenty of rest and maintain proper nutrition. Soups and broths with crackers or rice are fine. SEEK IMMEDIATE MEDICAL CARE IF:   You have severe headaches, shortness of breath, chest pain, neck pain, or an unusual rash.  You have uncontrolled vomiting, diarrhea, or you are unable to keep down fluids.  You or your child has an oral temperature above 102 F (38.9 C), not controlled by medicine.  Your baby is older than 3 months with a rectal temperature of 102 F (38.9 C) or higher.  Your baby is 66 months old or younger with a rectal temperature of 100.4 F (38 C) or higher. MAKE SURE YOU:   Understand these instructions.  Will watch your condition.  Will get help right away if you are not doing well or get worse. Document Released: 04/22/2005 Document Revised: 10/05/2011 Document Reviewed: 11/17/2010 [facility] Patient Information 2014 [place], [place].

## 2013-07-30 LAB — URINE CULTURE: Colony Count: >=100000

## 2013-09-17 ENCOUNTER — Emergency Department (INDEPENDENT_AMBULATORY_CARE_PROVIDER_SITE_OTHER): Payer: 59

## 2013-09-17 ENCOUNTER — Encounter (HOSPITAL_COMMUNITY): Payer: Self-pay | Admitting: Emergency Medicine

## 2013-09-17 ENCOUNTER — Emergency Department (HOSPITAL_COMMUNITY)
Admission: EM | Admit: 2013-09-17 | Discharge: 2013-09-17 | Disposition: A | Discharge status: Home / Self Care | Payer: 59 | Source: Home / Self Care | Attending: Family Medicine | Admitting: Family Medicine

## 2013-09-17 DIAGNOSIS — S8002XA Contusion of left knee, initial encounter: Secondary | ICD-10-CM

## 2013-09-17 DIAGNOSIS — S8000XA Contusion of unspecified knee, initial encounter: Secondary | ICD-10-CM
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
## 2013-09-17 NOTE — Discharge Instructions (Signed)
Contusion A contusion is a deep bruise. Contusions are the result of an injury that caused bleeding under the skin. The contusion may turn blue, purple, or yellow. Minor injuries will give you a painless contusion, but more severe contusions may stay painful and swollen for a few weeks.  CAUSES  A contusion is usually caused by a blow, trauma, or direct force to an area of the body. SYMPTOMS   Swelling and redness of the injured area.  Bruising of the injured area.  Tenderness and soreness of the injured area.  Pain. DIAGNOSIS  The diagnosis can be made by taking a history and physical exam. An X-ray, CT scan, or MRI may be needed to determine if there were any associated injuries, such as fractures. TREATMENT  Specific treatment will depend on what area of the body was injured. In general, the best treatment for a contusion is resting, icing, elevating, and applying cold compresses to the injured area. Over-the-counter medicines may also be recommended for pain control. Ask your caregiver what the best treatment is for your contusion. HOME CARE INSTRUCTIONS   Put ice on the injured area.  Put ice in a plastic bag.  Place a towel between your skin and the bag.  Leave the ice on for 15-20 minutes, 03-04 times a day.  Only take over-the-counter or prescription medicines for pain, discomfort, or fever as directed by your caregiver. Your caregiver may recommend avoiding anti-inflammatory medicines (aspirin, ibuprofen, and naproxen) for 48 hours because these medicines may increase bruising.  Rest the injured area.  If possible, elevate the injured area to reduce swelling. SEEK IMMEDIATE MEDICAL CARE IF:   You have increased bruising or swelling.  You have pain that is getting worse.  Your swelling or pain is not relieved with medicines. MAKE SURE YOU:   Understand these instructions.  Will watch your condition.  Will get help right away if you are not doing well or get  worse. Document Released: 04/22/2005 Document Revised: 10/05/2011 Document Reviewed: 05/18/2011 [facility]Exit[facility] Patient Information 2014 [place], [place].   Your xrays were normal. You can expect to be stiff and sore for the next few days. Use tylenol or ibuprofen as directed on packaging for discomfort.
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
## 2013-09-18 ENCOUNTER — Emergency Department (HOSPITAL_COMMUNITY)
Admission: EM | Admit: 2013-09-18 | Discharge: 2013-09-18 | Discharge status: Absent without leave | Payer: 59 | Source: Home / Self Care

## 2013-09-18 NOTE — ED Provider Notes (Signed)
Medical screening examination/treatment/procedure(s) were performed by a resident physician or non-physician practitioner and as the supervising physician I was immediately available for consultation/collaboration.  Derrill Bagnell, MD    Marquesa Rath S Priyansh Pry, MD 09/18/13 2020 

## 2013-11-02 ENCOUNTER — Ambulatory Visit (HOSPITAL_COMMUNITY): Payer: 59 | Admitting: Psychiatry

## 2013-12-13 ENCOUNTER — Ambulatory Visit (INDEPENDENT_AMBULATORY_CARE_PROVIDER_SITE_OTHER): Payer: 59 | Admitting: Psychiatry

## 2013-12-13 ENCOUNTER — Encounter (HOSPITAL_COMMUNITY): Payer: Self-pay | Admitting: Psychiatry

## 2013-12-13 VITALS — BP 90/60 | Ht 66.0 in | Wt 135.0 lb

## 2013-12-13 DIAGNOSIS — F3162 Bipolar disorder, current episode mixed, moderate: Secondary | ICD-10-CM | POA: Insufficient documentation

## 2013-12-13 DIAGNOSIS — F3160 Bipolar disorder, current episode mixed, unspecified: Secondary | ICD-10-CM

## 2013-12-13 MED ORDER — ALPRAZOLAM 0.5 MG PO TABS: 0.5000 mg | ORAL_TABLET | Freq: Three times a day (TID) | ORAL | Status: DC | PRN

## 2013-12-13 MED ORDER — CITALOPRAM HYDROBROMIDE 20 MG PO TABS: ORAL_TABLET | ORAL | Status: DC

## 2013-12-13 MED ORDER — LAMOTRIGINE 100 MG PO TABS: 100.0000 mg | ORAL_TABLET | Freq: Two times a day (BID) | ORAL | Status: DC
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
## 2014-01-08 ENCOUNTER — Ambulatory Visit (HOSPITAL_COMMUNITY): Payer: Self-pay | Admitting: Psychiatry

## 2014-03-13 ENCOUNTER — Encounter (HOSPITAL_COMMUNITY): Payer: Self-pay | Admitting: Psychiatry

## 2014-03-13 ENCOUNTER — Ambulatory Visit (INDEPENDENT_AMBULATORY_CARE_PROVIDER_SITE_OTHER): Payer: 59 | Admitting: Psychiatry

## 2014-03-13 VITALS — BP 105/62 | HR 82 | Ht 66.0 in | Wt 145.4 lb

## 2014-03-13 DIAGNOSIS — F3162 Bipolar disorder, current episode mixed, moderate: Secondary | ICD-10-CM

## 2014-03-13 MED ORDER — ALPRAZOLAM 0.5 MG PO TABS: 0.5000 mg | ORAL_TABLET | Freq: Three times a day (TID) | ORAL | Status: DC | PRN

## 2014-03-13 MED ORDER — LAMOTRIGINE 100 MG PO TABS: 100.0000 mg | ORAL_TABLET | Freq: Two times a day (BID) | ORAL | Status: DC

## 2014-03-13 MED ORDER — CITALOPRAM HYDROBROMIDE 20 MG PO TABS: ORAL_TABLET | ORAL | Status: DC
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
## 2014-03-14 ENCOUNTER — Ambulatory Visit (INDEPENDENT_AMBULATORY_CARE_PROVIDER_SITE_OTHER): Payer: 59 | Admitting: Psychology

## 2014-03-14 DIAGNOSIS — F3162 Bipolar disorder, current episode mixed, moderate: Secondary | ICD-10-CM

## 2014-03-30 ENCOUNTER — Ambulatory Visit (INDEPENDENT_AMBULATORY_CARE_PROVIDER_SITE_OTHER): Payer: 59 | Admitting: Psychology

## 2014-03-30 DIAGNOSIS — F3162 Bipolar disorder, current episode mixed, moderate: Secondary | ICD-10-CM

## 2014-04-20 ENCOUNTER — Ambulatory Visit (INDEPENDENT_AMBULATORY_CARE_PROVIDER_SITE_OTHER): Payer: 59 | Admitting: Psychology

## 2014-04-20 DIAGNOSIS — F3162 Bipolar disorder, current episode mixed, moderate: Secondary | ICD-10-CM

## 2014-05-10 ENCOUNTER — Ambulatory Visit (HOSPITAL_COMMUNITY): Payer: Self-pay | Admitting: Psychology

## 2014-05-14 ENCOUNTER — Ambulatory Visit (INDEPENDENT_AMBULATORY_CARE_PROVIDER_SITE_OTHER): Payer: 59 | Admitting: Psychiatry

## 2014-05-14 ENCOUNTER — Encounter (HOSPITAL_COMMUNITY): Payer: Self-pay | Admitting: Psychiatry

## 2014-05-14 VITALS — BP 114/70 | HR 85 | Ht 66.0 in | Wt 147.8 lb

## 2014-05-14 DIAGNOSIS — F3162 Bipolar disorder, current episode mixed, moderate: Secondary | ICD-10-CM

## 2014-05-14 MED ORDER — ALPRAZOLAM 0.5 MG PO TABS: 0.5000 mg | ORAL_TABLET | Freq: Three times a day (TID) | ORAL | Status: DC | PRN

## 2014-05-14 MED ORDER — LAMOTRIGINE 100 MG PO TABS: 100.0000 mg | ORAL_TABLET | Freq: Two times a day (BID) | ORAL | Status: DC

## 2014-05-14 MED ORDER — CITALOPRAM HYDROBROMIDE 20 MG PO TABS: ORAL_TABLET | ORAL | Status: DC
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
## 2014-05-28 ENCOUNTER — Encounter (HOSPITAL_COMMUNITY): Payer: Self-pay | Admitting: Psychiatry

## 2014-06-01 ENCOUNTER — Ambulatory Visit (INDEPENDENT_AMBULATORY_CARE_PROVIDER_SITE_OTHER): Payer: 59 | Admitting: Psychology

## 2014-06-01 ENCOUNTER — Encounter (HOSPITAL_COMMUNITY): Payer: Self-pay | Admitting: Psychology

## 2014-06-01 DIAGNOSIS — F411 Generalized anxiety disorder: Secondary | ICD-10-CM

## 2014-06-01 DIAGNOSIS — F3162 Bipolar disorder, current episode mixed, moderate: Secondary | ICD-10-CM

## 2014-06-01 NOTE — Progress Notes (Deleted)
Worked on issues of anxiety and depression.

## 2014-06-26 ENCOUNTER — Emergency Department (HOSPITAL_COMMUNITY)
Admission: EM | Admit: 2014-06-26 | Discharge: 2014-06-26 | Disposition: A | Discharge status: Home / Self Care | Payer: 59 | Source: Home / Self Care | Attending: Family Medicine | Admitting: Family Medicine

## 2014-06-26 ENCOUNTER — Encounter (HOSPITAL_COMMUNITY): Payer: Self-pay | Admitting: Emergency Medicine

## 2014-06-26 DIAGNOSIS — R42 Dizziness and giddiness: Secondary | ICD-10-CM

## 2014-06-26 LAB — CBC
HCT: 43.1 % (ref 36.0–46.0)
Hemoglobin: 14.5 g/dL (ref 12.0–15.0)
MCH: 29.1 pg (ref 26.0–34.0)
MCHC: 33.6 g/dL (ref 30.0–36.0)
MCV: 86.4 fL (ref 78.0–100.0)
Platelets: 269 10*3/uL (ref 150–400)
RBC: 4.99 MIL/uL (ref 3.87–5.11)
RDW: 12.7 % (ref 11.5–15.5)
WBC: 7.2 10*3/uL (ref 4.0–10.5)

## 2014-06-26 LAB — POCT URINALYSIS DIP (DEVICE)
Glucose, UA: NEGATIVE mg/dL
KETONES UR: NEGATIVE mg/dL
Leukocytes, UA: NEGATIVE
NITRITE: NEGATIVE
PH: 7 (ref 5.0–8.0)
Protein, ur: NEGATIVE mg/dL
Specific Gravity, Urine: 1.025 (ref 1.005–1.030)
UROBILINOGEN UA: 2 mg/dL — AB (ref 0.0–1.0)

## 2014-06-26 LAB — BASIC METABOLIC PANEL
Anion gap: 12 (ref 5–15)
BUN: 7 mg/dL (ref 6–23)
CO2: 26 mEq/L (ref 19–32)
Calcium: 9.1 mg/dL (ref 8.4–10.5)
Chloride: 101 mEq/L (ref 96–112)
Creatinine, Ser: 0.66 mg/dL (ref 0.50–1.10)
GFR calc Af Amer: >90 mL/min (ref >90)
GFR calc non Af Amer: >90 mL/min (ref >90)
Glucose, Bld: 82 mg/dL (ref 70–99)
Potassium: 4.4 mEq/L (ref 3.7–5.3)
Sodium: 139 mEq/L (ref 137–147)

## 2014-06-26 LAB — POCT PREGNANCY, URINE: Preg Test, Ur: NEGATIVE

## 2014-06-26 LAB — TSH: TSH: 3.34 u[IU]/mL (ref 0.350–4.500)

## 2014-06-26 NOTE — Discharge Instructions (Signed)
All of your testing today is normal. Your neurological exam is normal. The urine test reveals that you are mildly dehydrated so try to drink plenty of liquids over the next few days. Due to your recent cold symptoms you may benefit from taking Benadryl 25-50 mg at night to see if symptoms improve. Do follow up with Dr. Doristine CounterBurnett as planned. Dizziness  Dizziness means you feel unsteady or lightheaded. You might feel like you are going to pass out (faint). HOME CARE   Drink enough fluids to keep your pee (urine) clear or pale yellow.  Take your medicines exactly as told by your doctor. If you take blood pressure medicine, always stand up slowly from the lying or sitting position. Hold on to something to steady yourself.  If you need to stand in one place for a long time, move your legs often. Tighten and relax your leg muscles.  Have someone stay with you until you feel okay.  Do not drive or use heavy machinery if you feel dizzy.  Do not drink alcohol. GET HELP RIGHT AWAY IF:   You feel dizzy or lightheaded and it gets worse.  You feel sick to your stomach (nauseous), or you throw up (vomit).  You have trouble talking or walking.  You feel weak or have trouble using your arms, hands, or legs.  You cannot think clearly or have trouble forming sentences.  You have chest pain, belly (abdominal) pain, sweating, or you are short of breath.  Your vision changes.  You are bleeding.  You have problems from your medicine that seem to be getting worse. MAKE SURE YOU:   Understand these instructions.  Will watch your condition.  Will get help right away if you are not doing well or get worse. Document Released: 07/02/2011 Document Revised: 10/05/2011 Document Reviewed: 07/02/2011 [facility] Patient Information 2015 [employer] [place]. This information is not intended to replace advice given to you by your health care provider. Make sure you discuss any questions you have with your health care  provider.

## 2014-06-26 NOTE — ED Provider Notes (Signed)
CSN: [id number]     Arrival date & time 06/26/14  1722 History   First MD Initiated Contact with Patient 06/26/14 1813     Chief Complaint  Patient presents with  . Dizziness   Patient is a 23 y.o. female presenting with dizziness. The history is provided by the patient.  Dizziness Quality:  Imbalance and lightheadedness Severity:  Mild Onset quality:  Sudden Duration:  24 hours Timing:  Intermittent Progression:  Partially resolved Chronicity:  New Context: standing up   Context: not when bending over, not with ear pain, not with head movement and not with loss of consciousness   Relieved by:  None tried Worsened by:  Nothing tried Associated symptoms: chest pain and headaches   Associated symptoms: no shortness of breath   Risk factors: no anemia, no hx of stroke, no hx of vertigo, no Meniere's disease and no new medications   Pt reports onset of intermittent dizzy spells last night that was associated w/ a feeling like her skin was crawling and a feeling of beeing off balance at times. She also reports that she has had multiple brief episodes of CP that feel sharp and resolve spontaneously w/o treatment. CP is not associated w/ SOB or nausea. She has also experienced intermittent bilateral temporal area h/a's during this time as well. Denies fever, N/V/D or abd pain. Denies UTI sx's. Has had similar symptoms in the past but not as persistent. Of note pt also reports that she started her period last night as well. Admits to mild cold sx's last week that completely resolved.  Past Medical History  Diagnosis Date  . Anemia   . Infection     UTI  . Abnormal Pap smear     colpo  . Depression   . Mania   . Thyroid disease    Past Surgical History  Procedure Laterality Date  . No past surgeries    . Dilation and evacuation  06/04/2012    Procedure: DILATATION AND EVACUATION;  Surgeon: Bing Plumehomas F Henley, MD;  Location: [facility];  Service: Gynecology;  Laterality: N/A;   Family History   Problem Relation Age of Onset  . Other Neg Hx   . Anxiety disorder Father   . Depression Father   . Anxiety disorder Sister   . Depression Sister   . Anxiety disorder Sister   . OCD Sister    History  Substance Use Topics  . Smoking status: Current Every Day Smoker -- 0.50 packs/day for 6 years    Types: Cigarettes  . Smokeless tobacco: Never Used  . Alcohol Use: Yes     Comment: rare, not recently   OB History    Gravida Para Term Preterm AB TAB SAB Ectopic Multiple Living   2 1 1  0 0 0 0 0 0 1     Review of Systems  Constitutional: Positive for fatigue. Negative for fever and chills.  Eyes: Negative.   Respiratory: Negative for cough and shortness of breath.   Cardiovascular: Positive for chest pain.  Gastrointestinal: Negative.   Genitourinary: Negative.   Musculoskeletal: Negative.   Neurological: Positive for dizziness and headaches.  Psychiatric/Behavioral: Negative.     Allergies  Review of patient's allergies indicates no known allergies.  Home Medications   Prior to Admission medications   Medication Sig Start Date End Date Taking? Authorizing Provider  ALPRAZolam Prudy Feeler(XANAX) 0.5 MG tablet Take 1 tablet (0.5 mg total) by mouth 3 (three) times daily as needed for sleep or anxiety.  05/14/14 05/14/15  Diannia Rudereborah Ross, MD  citalopram (CELEXA) 20 MG tablet Take one twice a day 05/14/14   Diannia Rudereborah Ross, MD  lamoTRIgine (LAMICTAL) 100 MG tablet Take 1 tablet (100 mg total) by mouth 2 (two) times daily. 05/14/14   Diannia Rudereborah Ross, MD  levothyroxine (SYNTHROID, LEVOTHROID) 75 MCG tablet Take 75 mcg by mouth daily before breakfast.    Historical Provider, MD   BP 110/80 mmHg  Pulse 85  Temp(Src) 98.5 F (36.9 C) (Oral)  Resp 12  SpO2 99% Physical Exam  Constitutional: She is oriented to person, place, and time. She appears well-developed and well-nourished.  HENT:  Head: Normocephalic and atraumatic.  Right Ear: Tympanic membrane, external ear and ear canal normal.   Left Ear: Tympanic membrane and ear canal normal.  Nose: Nose normal.  Mouth/Throat: Uvula is midline and mucous membranes are normal. Normal dentition.  Eyes: Conjunctivae and EOM are normal. Pupils are equal, round, and reactive to light. Right eye exhibits no discharge. Left eye exhibits no discharge. No scleral icterus.  Neck: Normal range of motion. Neck supple.  Cardiovascular: Normal rate and regular rhythm.   Pulmonary/Chest: Effort normal and breath sounds normal.  Neurological: She is alert and oriented to person, place, and time. She has normal strength and normal reflexes. No cranial nerve deficit. She displays a negative Romberg sign. Coordination and gait normal.  Skin: Skin is warm and dry.  Psychiatric: She has a normal mood and affect.    ED Course  Procedures (including critical care time) Labs Review Labs Reviewed  POCT URINALYSIS DIP (DEVICE) - Abnormal; Notable for the following:    Bilirubin Urine SMALL (*)    Hgb urine dipstick MODERATE (*)    Urobilinogen, UA 2.0 (*)    All other components within normal limits  CBC  BASIC METABOLIC PANEL  TSH  POCT PREGNANCY, URINE    Imaging Review No results found.   MDM   1. Dizziness    Etiology unclear. Suspect associated w/ onset of menstruation and mild dehydration. Eustachian tube dysfunction considered given recent cold symptoms but less likely. EKG normal, All labs WNL. U/A w/ sp gr of 1.025. TSH WNL (h/o hypothyroidism on synthroid). Neuro exam completely non-focal. Pt plans to f/u w/ her PCP. Encouraged to rest and keep well hydrated and to return for worsening symptoms.     Leanne ChangKatherine P Adira Limburg, NP 06/27/14 [phone]

## 2014-06-26 NOTE — ED Notes (Signed)
C/i dizziness, chest soreness, mixed with stabbing pain, feels off balance - symptoms started last night

## 2014-07-02 ENCOUNTER — Ambulatory Visit (HOSPITAL_COMMUNITY): Payer: Self-pay | Admitting: Psychology

## 2014-07-04 ENCOUNTER — Encounter (HOSPITAL_COMMUNITY): Payer: Self-pay | Admitting: Psychology

## 2014-07-05 ENCOUNTER — Encounter (HOSPITAL_COMMUNITY): Payer: Self-pay | Admitting: Psychology
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
## 2014-07-05 NOTE — Progress Notes (Signed)
Patient:  Monica Patel   DOB: [birth year]  MR Number: [id number]  [facility] [address] [place] [address] Dept: [phone]  Start: 2 PM End: 3 PM  Provider/Observer:     Hershal CoriaJohn R Rodenbough PSYD  Chief Complaint:      Chief Complaint  Patient presents with  . Hallucinations  . Anxiety  . Stress    Reason For Service:     The patient was referred by Dr. Tenny Crawoss for psychotherapeutic interventions.  The patient is a 23 year old single Caucasian female who lives with her 24-year-old son. 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 The patient acknowledges a long history of mood disorder namely bipolar disorder since her teen years. There is a family history of bipolar disorder and substance abuse. The patient had been doing well until recently primarily due to the stressors at work. As the patient started doing more poorly she began having visual hallucinations. She reports that she would see a little boy and then a woman out of the corner of her eye. This caused a lot of distress. She reports that she would also have strange things happening to her whenever she tried to sleep. The patient does acknowledge some significant sleep disturbance and that she wakes up a lot and has difficulty going back to sleep. The patient reports that she had been taking naps during the day as well.   Interventions Strategy:  Cognitive/behavioral psychotherapeutic interventions  Participation Level:   Active  Participation Quality:  Appropriate      Behavioral Observation:  Well Groomed, Alert, and Appropriate.   Current Psychosocial Factors: The patient does report that she has been actively working on some of the sleep hygiene issues we developed and coping skills we have been working on. She reports that she has experienced a  reduction in the degree of visual hallucinations or what she describes as seeing ghosts. She reports the medications have helped as well..  Content of Session:   Reviewed current symptoms and work on therapeutic interventions around building better coping skills and stability around issues of sleep and mood stability.  Current Status:   The patient reports that she has been having less visual hallucinations/hypnagogic experiences now that she is sleeping better and she is taking the new medications  Patient Progress:   The patient has reporting that she has had some change with medications but continues to have visual disturbance.  Target Goals:   Target goals include working on sleep hygiene issues to see if this would have a significant beneficial effect on her visual hallucinations which appear to be at least in some ways hypnagogic in nature.  Last Reviewed:   03/30/2014  Goals Addressed Today:    Goals addressed today had to do with building and working on sleep hygiene issues as well as working on cognitive and behavioral therapeutic interventions.  Impression/Diagnosis:   The patient has a family history of bipolar disorder and began having mood disturbance in her late teen years. More recently after being managed quite well with medications for some time she began having a lot of stress at work and her mood disturbance increased and she also began having sleep disturbance in sleep deprivation along with the experience of visual hallucinations that she attributed to ghost her spirits in some way.  Diagnosis:    Axis I: Bipolar 1  disorder, mixed, moderate

## 2014-07-05 NOTE — Progress Notes (Signed)
Patient:  Monica Patel   DOB: 02-25-1991  MR Number: [id number]  Location[facility] [address] [place] [address] Dept: [phone]  Start: 11 AM End: 12 PM  Provider/Observer:     Hershal CoriaJohn R Rodenbough PSYD  Chief Complaint:      Chief Complaint  Patient presents with  . Agitation  . Anxiety  . Stress    Reason For Service:     The patient was referred by Dr. Tenny Crawoss for psychotherapeutic interventions.  The patient is a 23 year old single Caucasian female who lives with her 24-year-old son. 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 The patient acknowledges a long history of mood disorder namely bipolar disorder since her teen years. There is a family history of bipolar disorder and substance abuse. The patient had been doing well until recently primarily due to the stressors at work. As the patient started doing more poorly she began having visual hallucinations. She reports that she would see a little boy and then a woman out of the corner of her eye. This caused a lot of distress. She reports that she would also have strange things happening to her whenever she tried to sleep. The patient does acknowledge some significant sleep disturbance and that she wakes up a lot and has difficulty going back to sleep. The patient reports that she had been taking naps during the day as well.   Interventions Strategy:  Cognitive/behavioral psychotherapeutic interventions  Participation Level:   Active  Participation Quality:  Appropriate      Behavioral Observation:  Well Groomed, Alert, and Appropriate.   Current Psychosocial Factors: The patient does report that she has been sleeping a lot better and her stress level is been got better. The patient reports that there is been a significant reduction in her  hypnagogic/hallucination experiences and that she is feeling much more relaxed and stable..  Content of Session:   Reviewed current symptoms and work on therapeutic interventions around building better coping skills and stability around issues of sleep and mood stability.  Current Status:   The patient reports that she has been having little to no visual hallucinations or hypnagogic experiences. She reports that she has had a significant improvement in her sleep pattern utilizing the sleep hygiene techniques and we discussed.  Patient Progress:   The patient has reporting that she has had some change with medications but continues to have visual disturbance.  Target Goals:   Target goals include working on sleep hygiene issues to see if this would have a significant beneficial effect on her visual hallucinations which appear to be at least in some ways hypnagogic in nature.  Last Reviewed:   04/20/2014  Goals Addressed Today:    Goals addressed today had to do with building and working on sleep hygiene issues as well as working on cognitive and behavioral therapeutic interventions.  Impression/Diagnosis:   The patient has a family history of bipolar disorder and began having mood disturbance in her late teen years. More recently after being managed quite well with medications for some time she began having a lot of stress at work and her mood disturbance increased and she also began having sleep disturbance in sleep deprivation along with the experience of visual hallucinations that she attributed to ghost her spirits in some way.  Diagnosis:    Axis I: Bipolar  1 disorder, mixed, moderate     RODENBOUGH,JOHN R, PsyD 07/05/2014

## 2014-08-10 ENCOUNTER — Ambulatory Visit (HOSPITAL_COMMUNITY): Payer: Self-pay | Admitting: Psychology

## 2014-08-14 ENCOUNTER — Ambulatory Visit (HOSPITAL_COMMUNITY): Payer: Self-pay | Admitting: Psychiatry

## 2014-08-14 ENCOUNTER — Encounter (HOSPITAL_COMMUNITY): Payer: Self-pay | Admitting: Psychiatry

## 2014-09-02 ENCOUNTER — Emergency Department (HOSPITAL_COMMUNITY)
Admission: EM | Admit: 2014-09-02 | Discharge: 2014-09-02 | Disposition: A | Discharge status: Home / Self Care | Payer: 59 | Source: Home / Self Care | Attending: Emergency Medicine | Admitting: Emergency Medicine

## 2014-09-02 ENCOUNTER — Encounter (HOSPITAL_COMMUNITY): Payer: Self-pay | Admitting: *Deleted

## 2014-09-02 DIAGNOSIS — N12 Tubulo-interstitial nephritis, not specified as acute or chronic: Secondary | ICD-10-CM

## 2014-09-02 HISTORY — DX: Other allergy status, other than to drugs and biological substances: Z91.09

## 2014-09-02 LAB — POCT PREGNANCY, URINE: Preg Test, Ur: NEGATIVE

## 2014-09-02 LAB — POCT URINALYSIS DIP (DEVICE)
GLUCOSE, UA: 250 mg/dL — AB
NITRITE: POSITIVE — AB
Protein, ur: >=300 mg/dL — AB
Specific Gravity, Urine: 1.02 (ref 1.005–1.030)
Urobilinogen, UA: >=8 mg/dL (ref 0.0–1.0)
pH: 6.5 (ref 5.0–8.0)

## 2014-09-02 MED ORDER — PROMETHAZINE HCL 25 MG PO TABS: 25.0000 mg | ORAL_TABLET | Freq: Four times a day (QID) | ORAL | Status: DC | PRN

## 2014-09-02 MED ORDER — CIPROFLOXACIN HCL 500 MG PO TABS: 500.0000 mg | ORAL_TABLET | Freq: Two times a day (BID) | ORAL | Status: DC

## 2014-09-02 MED ORDER — PREDNISONE 20 MG PO TABS
ORAL_TABLET | ORAL | Status: AC
  Filled 2014-09-02: qty 2

## 2014-09-02 MED ORDER — PREDNISONE 10 MG PO TABS
ORAL_TABLET | ORAL | Status: AC
  Filled 2014-09-02: qty 1

## 2014-09-02 MED ORDER — IPRATROPIUM-ALBUTEROL 0.5-2.5 (3) MG/3ML IN SOLN
RESPIRATORY_TRACT | Status: AC
  Filled 2014-09-02: qty 3

## 2014-09-02 NOTE — ED Notes (Signed)
C/o bil. flank pain onset Fri. Chills and fever, N and V onset yesterday.

## 2014-09-02 NOTE — Discharge Instructions (Signed)
You have an infection in your kidneys. Take Cipro 1 pill twice a day for 7 days. Use Phenergan as needed for nausea. If you are getting worse despite the antibiotics or you are unable to keep the antibiotics down please go to the emergency room.

## 2014-09-02 NOTE — ED Provider Notes (Signed)
CSN: [id number]     Arrival date & time 09/02/14  1448 History   First MD Initiated Contact with Patient 09/02/14 1516     Chief Complaint  Patient presents with  . Urinary Tract Infection   (Consider location/radiation/quality/duration/timing/severity/associated sxs/prior Treatment) HPI She is a 24 year old woman here for evaluation of urinary tract infection. Her symptoms started on Friday with dysuria, frequency, urgency. She also describes fever and chills. She has had nausea and vomiting. She also describes some bilateral flank pain.  Past Medical History  Diagnosis Date  . Anemia   . Infection     UTI  . Abnormal Pap smear     colpo  . Depression   . Mania   . Thyroid disease   . 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 No past surgeries    . Dilation and evacuation  06/04/2012    Procedure: DILATATION AND EVACUATION;  Surgeon: Bing Plume, MD;  Location: [facility];  Service: Gynecology;  Laterality: N/A;   Family History  Problem Relation Age of Onset  . Other Neg Hx   . Anxiety disorder Father   . Depression Father   . Anxiety disorder Sister   . Depression Sister   . Anxiety disorder Sister   . OCD Sister   . Depression Mother    History  Substance Use Topics  . Smoking status: Current Every Day Smoker -- 0.50 packs/day for 6 years    Types: Cigarettes  . Smokeless tobacco: Never Used  . Alcohol Use: No   OB History    Gravida Para Term Preterm AB TAB SAB Ectopic Multiple Living   0 0 0 0 0 0 1     Review of Systems  Constitutional: Positive for fever and chills.  Gastrointestinal: Positive for nausea and vomiting.  Genitourinary: Positive for dysuria, urgency and frequency.    Allergies  Review of patient's allergies indicates no known allergies.  Home Medications   Prior to Admission medications   Medication Sig Start Date End Date Taking? Authorizing Provider  ALPRAZolam Prudy Feeler) 0.5 MG tablet Take 1  tablet (0.5 mg total) by mouth 3 (three) times daily as needed for sleep or anxiety. 05/14/14 05/14/15 Yes Diannia Ruder, MD  citalopram (CELEXA) 20 MG tablet Take one twice a day 05/14/14  Yes Diannia Ruder, MD  lamoTRIgine (LAMICTAL) 100 MG tablet Take 1 tablet (100 mg total) by mouth 2 (two) times daily. 05/14/14  Yes Diannia Ruder, MD  levothyroxine (SYNTHROID, LEVOTHROID) 75 MCG tablet Take 75 mcg by mouth daily before breakfast.   Yes Historical Provider, MD  ciprofloxacin (CIPRO) 500 MG tablet Take 1 tablet (500 mg total) by mouth 2 (two) times daily. 09/02/14   Charm Rings, MD  promethazine (PHENERGAN) 25 MG tablet Take 1 tablet (25 mg total) by mouth every 6 (six) hours as needed for nausea or vomiting. 09/02/14   Charm Rings, MD   BP 105/75 mmHg  Pulse 118  Temp(Src) 100.6 F (38.1 C) (Oral)  Resp 19  SpO2 97%  LMP 08/18/2014 Physical Exam  Constitutional: She is oriented to person, place, and time. She appears well-developed and well-nourished. No distress.  Cardiovascular: Regular rhythm and normal heart sounds.  Tachycardia present.   No murmur heard. Pulmonary/Chest: Effort normal.  Abdominal:  Bilateral CVA tenderness  Neurological: She is alert and oriented to person, place, and time.    ED Course  Procedures (including critical  care time) Labs Review Labs Reviewed  POCT URINALYSIS DIP (DEVICE) - Abnormal; Notable for the following:    Glucose, UA 250 (*)    Bilirubin Urine MODERATE (*)    Ketones, ur TRACE (*)    Hgb urine dipstick MODERATE (*)    Protein, ur >=300 (*)    Nitrite POSITIVE (*)    Leukocytes, UA LARGE (*)    All other components within normal limits  URINE CULTURE  POCT PREGNANCY, URINE    Imaging Review No results found.   MDM   1. Pyelonephritis    We'll treat with Cipro for 7 days. Phenergan prescription provided for nausea. Urine sent for culture. She'll go to the ER if no improvement over the next 2 days or if she is unable to keep  the medication down.    Charm RingsErin J Honig, MD 09/02/14 [id number]

## 2014-09-04 ENCOUNTER — Ambulatory Visit (HOSPITAL_COMMUNITY): Payer: Self-pay | Admitting: Psychology

## 2014-09-04 LAB — URINE CULTURE

## 2014-09-05 NOTE — ED Notes (Signed)
Urine culture: >100,000 colonies Klebsiella pneumoniae.  Pt. adequately treated with Cipro. Monica MoselleYork, Monica Patel M 09/05/2014

## 2014-09-10 ENCOUNTER — Emergency Department (HOSPITAL_COMMUNITY)
Admission: EM | Admit: 2014-09-10 | Discharge: 2014-09-10 | Disposition: A | Discharge status: Home / Self Care | Payer: 59 | Source: Home / Self Care | Attending: Family Medicine | Admitting: Family Medicine

## 2014-09-10 ENCOUNTER — Encounter (HOSPITAL_COMMUNITY): Payer: Self-pay | Admitting: *Deleted

## 2014-09-10 DIAGNOSIS — A609 Anogenital herpesviral infection, unspecified: Secondary | ICD-10-CM

## 2014-09-10 MED ORDER — VALACYCLOVIR HCL 1 G PO TABS: 1000.0000 mg | ORAL_TABLET | Freq: Two times a day (BID) | ORAL | Status: DC

## 2014-09-10 MED ORDER — LIDOCAINE 5 % EX OINT: 1.0000 "application " | TOPICAL_OINTMENT | Freq: Three times a day (TID) | CUTANEOUS | Status: DC | PRN

## 2014-09-10 NOTE — Discharge Instructions (Signed)
Thank you for coming in today. ° °Genital Herpes °Genital herpes is a sexually transmitted disease. This means that it is a disease passed by having sex with an infected person. There is no cure for genital herpes. The time between attacks can be months to years. The virus may live in a person but produce no problems (symptoms). This infection can be passed to a baby as it travels down the birth canal (vagina). In a newborn, this can cause central nervous system damage, eye damage, or even death. The virus that causes genital herpes is usually HSV-2 virus. The virus that causes oral herpes is usually HSV-1. The diagnosis (learning what is wrong) is made through culture results. °SYMPTOMS  °Usually symptoms of pain and itching begin a few days to a week after contact. It first appears as small blisters that progress to small painful ulcers which then scab over and heal after several days. It affects the outer genitalia, birth canal, cervix, penis, anal area, buttocks, and thighs. °HOME CARE INSTRUCTIONS  °· Keep ulcerated areas dry and clean. °· Take medications as directed. Antiviral medications can speed up healing. They will not prevent recurrences or cure this infection. These medications can also be taken for suppression if there are frequent recurrences. °· While the infection is active, it is contagious. Avoid all sexual contact during active infections. °· Condoms may help prevent spread of the herpes virus. °· Practice safe sex. °· Wash your hands thoroughly after touching the genital area. °· Avoid touching your eyes after touching your genital area. °· Inform your caregiver if you have had genital herpes and become pregnant. It is your responsibility to insure a safe outcome for your baby in this pregnancy. °· Only take over-the-counter or prescription medicines for pain, discomfort, or fever as directed by your caregiver. °SEEK MEDICAL CARE IF:  °· You have a recurrence of this infection. °· You do not  respond to medications and are not improving. °· You have new sources of pain or discharge which have changed from the original infection. °· You have an oral temperature above 102° F (38.9° C). °· You develop abdominal pain. °· You develop eye pain or signs of eye infection. °Document Released: 07/10/2000 Document Revised: 10/05/2011 Document Reviewed: 07/31/2009 °ExitCare® Patient Information ©2015 [employer]. This information is not intended to replace advice given to you by your health care provider. Make sure you discuss any questions you have with your health care provider. ° °

## 2014-09-10 NOTE — ED Notes (Signed)
Pt  Reports  Red  Irritated  Area   Perianal  Area        With lesions  X  1  Week     denys  Any  Vaginal    Bleeding or  Discharge

## 2014-09-10 NOTE — ED Provider Notes (Signed)
Monica Patel is a 24 y.o. female who presents to Urgent Care today for perirectal rash.  . She developed a painful rash around her anus present about a week ago. She has tried ice Neosporin and Tucks pads which have not helped. Patient recently was exposed to Cipro and Macrobid for a urinary tract infection. She denies any history of genital herpes previously. No vaginal discharge. The rash is extremely painful.   Past Medical History  Diagnosis Date  . Anemia   . Infection     UTI  . Abnormal Pap smear     colpo  . Depression   . Mania   . Thyroid disease   . 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 No past surgeries    . Dilation and evacuation  06/04/2012    Procedure: DILATATION AND EVACUATION;  Surgeon: Bing Plumehomas F Henley, MD;  Location: [facility];  Service: Gynecology;  Laterality: N/A;   History  Substance Use Topics  . Smoking status: Current Every Day Smoker -- 0.50 packs/day for 6 years    Types: Cigarettes  . Smokeless tobacco: Never Used  . Alcohol Use: No   ROS as above Medications: No current facility-administered medications for this encounter.   Current Outpatient Prescriptions  Medication Sig Dispense Refill  . ALPRAZolam (XANAX) 0.5 MG tablet Take 1 tablet (0.5 mg total) by mouth 3 (three) times daily as needed for sleep or anxiety. 90 tablet 2  . ciprofloxacin (CIPRO) 500 MG tablet Take 1 tablet (500 mg total) by mouth 2 (two) times daily. 14 tablet 0  . citalopram (CELEXA) 20 MG tablet Take one twice a day 180 tablet 2  . lamoTRIgine (LAMICTAL) 100 MG tablet Take 1 tablet (100 mg total) by mouth 2 (two) times daily. 180 tablet 2  . levothyroxine (SYNTHROID, LEVOTHROID) 75 MCG tablet Take 75 mcg by mouth daily before breakfast.    . lidocaine (XYLOCAINE) 5 % ointment Apply 1 application topically 3 (three) times daily as needed. 30 g 1  . promethazine (PHENERGAN) 25 MG tablet Take 1 tablet (25 mg total) by mouth every 6 (six) hours  as needed for nausea or vomiting. 30 tablet 0  . valACYclovir (VALTREX) 1000 MG tablet Take 1 tablet (1,000 mg total) by mouth 2 (two) times daily. 14 tablet 0   No Known Allergies   Exam:  BP 105/70 mmHg  Pulse 84  Temp(Src) 98.7 F (37.1 C) (Oral)  Resp 16  SpO2 99%  LMP 08/18/2014 Gen: Well NAD Perirectal skin with vesicular formation and ulceration. Tender to palpation. Consistent with genital herpes.  No results found for this or any previous visit (from the past 24 hour(s)). No results found.  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 Treat with Valtrex and lidocaine ointment. HSV culture pending  Discussed warning signs or symptoms. Please see discharge instructions. Patient expresses understanding.     Rodolph BongEvan S Loi Rennaker, MD 09/10/14 [id number]

## 2014-09-12 ENCOUNTER — Telehealth (HOSPITAL_COMMUNITY): Payer: Self-pay | Admitting: *Deleted

## 2014-09-12 LAB — HERPES SIMPLEX VIRUS CULTURE
Culture: DETECTED
Special Requests: NORMAL

## 2014-09-12 NOTE — ED Notes (Addendum)
Herpes culture: Herpes Simplex Type 2.  Pt. adequately treated with Valtrex.  I called pt. and left a message to call.  Call 1. Monica Patel, Monica Patel 09/12/2014 Pt. left a message on VM. 09/13/2014 I called pt. back.  Pt. verified x 2 and given result.  Pt. told she was adequately treated with Valtrex and to finish all of the medication. Pt. does have an OB-GYN with whom she can follow-up. Pt. instructed to notify her partner, that she can pass the virus even when she doesn't have an outbreak, so always practice safe sex and to get treated for each outbreak with Acyclovir or Valtrex. Pt. instructed that she can get a 1 yr Rx to fill and take at the beginning of each outbreak or she can get suppressive treatment if having frequent outbreaks.  Pt. voiced understanding.  09/14/2014

## 2014-09-13 ENCOUNTER — Ambulatory Visit (INDEPENDENT_AMBULATORY_CARE_PROVIDER_SITE_OTHER): Payer: 59 | Admitting: Psychiatry

## 2014-09-13 ENCOUNTER — Encounter (HOSPITAL_COMMUNITY): Payer: Self-pay | Admitting: Psychiatry

## 2014-09-13 VITALS — BP 111/75 | HR 88 | Ht 66.0 in | Wt 153.0 lb

## 2014-09-13 DIAGNOSIS — F3162 Bipolar disorder, current episode mixed, moderate: Secondary | ICD-10-CM

## 2014-09-13 MED ORDER — CITALOPRAM HYDROBROMIDE 20 MG PO TABS: ORAL_TABLET | ORAL | Status: DC

## 2014-09-13 MED ORDER — ALPRAZOLAM 0.5 MG PO TABS: 0.5000 mg | ORAL_TABLET | Freq: Three times a day (TID) | ORAL | Status: DC | PRN

## 2014-09-13 MED ORDER — LAMOTRIGINE 100 MG PO TABS: 100.0000 mg | ORAL_TABLET | Freq: Two times a day (BID) | ORAL | Status: DC
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
## 2014-09-17 ENCOUNTER — Ambulatory Visit (INDEPENDENT_AMBULATORY_CARE_PROVIDER_SITE_OTHER): Payer: 59 | Admitting: Psychology

## 2014-09-17 DIAGNOSIS — F3162 Bipolar disorder, current episode mixed, moderate: Secondary | ICD-10-CM | POA: Diagnosis not present

## 2014-09-17 DIAGNOSIS — F411 Generalized anxiety disorder: Secondary | ICD-10-CM | POA: Diagnosis not present

## 2014-10-11 ENCOUNTER — Encounter (HOSPITAL_COMMUNITY): Payer: Self-pay | Admitting: Psychology

## 2014-10-11 ENCOUNTER — Ambulatory Visit (INDEPENDENT_AMBULATORY_CARE_PROVIDER_SITE_OTHER): Payer: 59 | Admitting: Psychology

## 2014-10-11 DIAGNOSIS — F411 Generalized anxiety disorder: Secondary | ICD-10-CM

## 2014-10-11 DIAGNOSIS — F3162 Bipolar disorder, current episode mixed, moderate: Secondary | ICD-10-CM
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
## 2014-10-12 ENCOUNTER — Ambulatory Visit (HOSPITAL_COMMUNITY): Payer: Self-pay | Admitting: Psychology

## 2014-11-14 ENCOUNTER — Ambulatory Visit (HOSPITAL_COMMUNITY): Payer: Self-pay | Admitting: Psychology

## 2014-12-04 ENCOUNTER — Ambulatory Visit (HOSPITAL_COMMUNITY): Payer: Self-pay | Admitting: Psychology

## 2014-12-06 ENCOUNTER — Encounter (HOSPITAL_COMMUNITY): Payer: Self-pay | Admitting: *Deleted

## 2014-12-12 ENCOUNTER — Encounter (HOSPITAL_COMMUNITY): Payer: Self-pay | Admitting: Psychiatry

## 2014-12-12 ENCOUNTER — Ambulatory Visit (INDEPENDENT_AMBULATORY_CARE_PROVIDER_SITE_OTHER): Payer: 59 | Admitting: Psychiatry

## 2014-12-12 VITALS — BP 108/69 | HR 93 | Ht 66.0 in | Wt 155.0 lb

## 2014-12-12 DIAGNOSIS — F3160 Bipolar disorder, current episode mixed, unspecified: Secondary | ICD-10-CM

## 2014-12-12 DIAGNOSIS — F3162 Bipolar disorder, current episode mixed, moderate: Secondary | ICD-10-CM

## 2014-12-12 MED ORDER — CITALOPRAM HYDROBROMIDE 20 MG PO TABS: ORAL_TABLET | ORAL | Status: DC

## 2014-12-12 MED ORDER — LAMOTRIGINE 100 MG PO TABS: 100.0000 mg | ORAL_TABLET | Freq: Two times a day (BID) | ORAL | Status: DC

## 2014-12-12 MED ORDER — ALPRAZOLAM 0.5 MG PO TABS: 0.5000 mg | ORAL_TABLET | Freq: Three times a day (TID) | ORAL | Status: DC | PRN
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
## 2015-03-08 ENCOUNTER — Ambulatory Visit (INDEPENDENT_AMBULATORY_CARE_PROVIDER_SITE_OTHER): Payer: 59 | Admitting: Internal Medicine

## 2015-03-08 VITALS — BP 100/68 | HR 62 | Temp 98.3°F | Resp 14 | Ht 65.5 in | Wt 156.0 lb

## 2015-03-08 DIAGNOSIS — J029 Acute pharyngitis, unspecified: Secondary | ICD-10-CM

## 2015-03-08 LAB — POCT CBC
GRANULOCYTE PERCENT: 60.8 % (ref 37–80)
HEMATOCRIT: 42.2 % (ref 37.7–47.9)
HEMOGLOBIN: 13.1 g/dL (ref 12.2–16.2)
LYMPH, POC: 2.2 (ref 0.6–3.4)
MCH: 26.3 pg — AB (ref 27–31.2)
MCHC: 31.1 g/dL — AB (ref 31.8–35.4)
MCV: 84.5 fL (ref 80–97)
MID (CBC): 0.3 (ref 0–0.9)
MPV: 8.1 fL (ref 0–99.8)
POC GRANULOCYTE: 3.9 (ref 2–6.9)
POC LYMPH PERCENT: 34.2 %L (ref 10–50)
POC MID %: 5 %M (ref 0–12)
Platelet Count, POC: 272 10*3/uL (ref 142–424)
RBC: 4.99 M/uL (ref 4.04–5.48)
RDW, POC: 13.4 %
WBC: 6.4 10*3/uL (ref 4.6–10.2)

## 2015-03-08 LAB — POCT RAPID STREP A (OFFICE): Rapid Strep A Screen: NEGATIVE

## 2015-03-08 NOTE — Progress Notes (Addendum)
Subjective:    Patient ID: Monica Patel, female    DOB: 1990-09-28, 24 y.o.   MRN: [id number] This chart was scribed for Ellamae Sia, MD by Jolene Provost, Medical Scribe. This patient was seen in Room 8 and the patient's care was started a 12:19 PM.  Chief Complaint  Patient presents with   Cough    States she has coughed up blood   Chest Pain    x 2 weeks   Muscle Pain    Aches   States she works w/ many chemicals    HPI HPI Comments: Monica Patel is a 24 y.o. female who presents to [facility] complaining of cough and sore throat (worse on left side) for the last two weeks that became worse over the last two days. Endorses associated fever, nausea and vomiting. Pt denies sleeping disturbance or nasal discharge. 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  Pt reported to her PCP for CP, SOB and numbness and was told that she was probably having panic attacks, and was told to increase her anxiety medication. 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    Review of Systems  Constitutional: Negative for fever and chills.  Respiratory: Positive for cough and chest tightness.   Cardiovascular: Positive for chest pain.  Musculoskeletal: Positive for myalgias.       Objective:   Physical Exam  Constitutional: She is oriented to person, place, and time. She appears well-developed and well-nourished. No distress.  HENT:  Head: Normocephalic and atraumatic.  Right Ear: External ear normal.  Left Ear: External ear normal.  Nose: Nose normal.  Phar sl red  Eyes: Conjunctivae are normal. Pupils are equal, round, and reactive to light.  Neck: Neck supple. No  thyromegaly present.  2+ tender anterior cervical lymphnodes. Throat red with little exudates and no ulcers.   Cardiovascular: Normal rate, regular rhythm and normal heart sounds.   No murmur heard. Pulmonary/Chest: Effort normal and breath sounds normal. No respiratory distress. She has no wheezes.  Musculoskeletal: Normal range of motion.  Lymphadenopathy:    She has cervical adenopathy.  Neurological: She is alert and oriented to person, place, and time. Coordination normal.  Skin: Skin is warm and dry. No rash noted. She is not diaphoretic.  Psychiatric: She has a normal mood and affect. Her behavior is normal.  Nursing note and vitals reviewed.   Filed Vitals:   03/08/15 1059  BP: 100/68  Pulse: 62  Temp: 98.3 F (36.8 C)  TempSrc: Oral  Resp: 14  Height: 5' 5.5" (1.664 m)  Weight: 156 lb (70.761 kg)  SpO2: 99%   Results for orders placed or performed in visit on 03/08/15  POCT CBC  Result Value Ref Range   WBC 6.4 4.6 - 10.2 K/uL   Lymph, poc 2.2 0.6 - 3.4   POC LYMPH PERCENT 34.2 10 - 50 %L   MID (cbc) 0.3 0 - 0.9   POC MID % 5.0 0 - 12 %M   POC Granulocyte 3.9 2 - 6.9   Granulocyte percent 60.8 37 - 80 %G   RBC 4.99 4.04 -  5.48 M/uL   Hemoglobin 13.1 12.2 - 16.2 g/dL   HCT, POC 16.1 09.6 - 47.9 %   MCV 84.5 80 - 97 fL   MCH, POC 26.3 (A) 27 - 31.2 pg   MCHC 31.1 (A) 31.8 - 35.4 g/dL   RDW, POC 04.5 %   Platelet Count, POC 272 142 - 424 K/uL   MPV 8.1 0 - 99.8 fL  POCT rapid strep A  Result Value Ref Range   Rapid Strep A Screen Negative Negative       Assessment & Plan:   Sore throat - Plan: POCT CBC, POCT rapid strep A OTC meds only---reassured viral No orders of the defined types were placed in this encounter.   I have completed the patient encounter in its entirety as documented by the scribe, with editing by me where necessary. Robert P. Merla Riches, M.D.

## 2015-03-14 ENCOUNTER — Telehealth (HOSPITAL_COMMUNITY): Payer: Self-pay | Admitting: *Deleted

## 2015-03-14 ENCOUNTER — Encounter (HOSPITAL_COMMUNITY): Payer: Self-pay | Admitting: Psychiatry

## 2015-03-14 ENCOUNTER — Ambulatory Visit (INDEPENDENT_AMBULATORY_CARE_PROVIDER_SITE_OTHER): Payer: 59 | Admitting: Psychiatry

## 2015-03-14 VITALS — BP 100/70 | Ht 65.0 in | Wt 156.0 lb

## 2015-03-14 DIAGNOSIS — F3162 Bipolar disorder, current episode mixed, moderate: Secondary | ICD-10-CM

## 2015-03-14 MED ORDER — ALPRAZOLAM 1 MG PO TABS: 1.0000 mg | ORAL_TABLET | Freq: Three times a day (TID) | ORAL | Status: DC | PRN

## 2015-03-14 MED ORDER — CITALOPRAM HYDROBROMIDE 20 MG PO TABS: ORAL_TABLET | ORAL | Status: DC

## 2015-03-14 MED ORDER — ALPRAZOLAM 0.5 MG PO TABS: 0.5000 mg | ORAL_TABLET | Freq: Three times a day (TID) | ORAL | Status: DC | PRN

## 2015-03-14 MED ORDER — CARBAMAZEPINE ER 200 MG PO CP12: 200.0000 mg | ORAL_CAPSULE | Freq: Two times a day (BID) | ORAL | Status: DC

## 2015-03-14 NOTE — Telephone Encounter (Signed)
Pt [facility]) called to ask if Dr. Tenny Craw could give them permission to fill pt Equetro for generic instead of band name. Per pharmacist, if pt gets brand name pt would have to pay $75.00 co-pay after insurance but if she was switched to generic, pt co-pay would be $4.00. Pharmacist direct number is [phone].

## 2015-03-14 NOTE — Telephone Encounter (Signed)
ok 
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
## 2015-03-15 ENCOUNTER — Telehealth (HOSPITAL_COMMUNITY): Payer: Self-pay | Admitting: *Deleted

## 2015-03-15 NOTE — Telephone Encounter (Signed)
CARBAMAZEPINE, WAS WRITTEN FOR BRAND NAME, TOO EXPENSIVE.   CAN YOU SWITCH TO GENERIC BRAND.

## 2015-03-15 NOTE — Telephone Encounter (Signed)
Spoke with Lupita Leash and informed her of what Dr.Ross stated and she showed understanding

## 2015-04-02 ENCOUNTER — Emergency Department (HOSPITAL_COMMUNITY)
Admission: EM | Admit: 2015-04-02 | Discharge: 2015-04-03 | Disposition: A | Discharge status: Home / Self Care | Payer: 59 | Attending: Psychiatry | Admitting: Psychiatry

## 2015-04-02 ENCOUNTER — Encounter (HOSPITAL_COMMUNITY): Payer: Self-pay | Admitting: *Deleted

## 2015-04-02 DIAGNOSIS — Z720 Tobacco use: Secondary | ICD-10-CM | POA: Insufficient documentation

## 2015-04-02 DIAGNOSIS — Y9389 Activity, other specified: Secondary | ICD-10-CM | POA: Insufficient documentation

## 2015-04-02 DIAGNOSIS — Z8619 Personal history of other infectious and parasitic diseases: Secondary | ICD-10-CM | POA: Diagnosis not present

## 2015-04-02 DIAGNOSIS — Z862 Personal history of diseases of the blood and blood-forming organs and certain disorders involving the immune mechanism: Secondary | ICD-10-CM | POA: Insufficient documentation

## 2015-04-02 DIAGNOSIS — Z3202 Encounter for pregnancy test, result negative: Secondary | ICD-10-CM | POA: Diagnosis not present

## 2015-04-02 DIAGNOSIS — Z79899 Other long term (current) drug therapy: Secondary | ICD-10-CM | POA: Diagnosis not present

## 2015-04-02 DIAGNOSIS — E079 Disorder of thyroid, unspecified: Secondary | ICD-10-CM | POA: Diagnosis not present

## 2015-04-02 DIAGNOSIS — F1310 Sedative, hypnotic or anxiolytic abuse, uncomplicated: Secondary | ICD-10-CM | POA: Insufficient documentation

## 2015-04-02 DIAGNOSIS — Y998 Other external cause status: Secondary | ICD-10-CM | POA: Insufficient documentation

## 2015-04-02 DIAGNOSIS — Y9289 Other specified places as the place of occurrence of the external cause: Secondary | ICD-10-CM | POA: Insufficient documentation

## 2015-04-02 DIAGNOSIS — Z87440 Personal history of urinary (tract) infections: Secondary | ICD-10-CM | POA: Insufficient documentation

## 2015-04-02 DIAGNOSIS — F419 Anxiety disorder, unspecified: Secondary | ICD-10-CM | POA: Diagnosis not present

## 2015-04-02 DIAGNOSIS — T50902A Poisoning by unspecified drugs, medicaments and biological substances, intentional self-harm, initial encounter: Secondary | ICD-10-CM

## 2015-04-02 DIAGNOSIS — T426X2A Poisoning by other antiepileptic and sedative-hypnotic drugs, intentional self-harm, initial encounter: Secondary | ICD-10-CM | POA: Insufficient documentation

## 2015-04-02 DIAGNOSIS — F319 Bipolar disorder, unspecified: Secondary | ICD-10-CM | POA: Diagnosis not present

## 2015-04-02 LAB — COMPREHENSIVE METABOLIC PANEL
ALK PHOS: 87 U/L (ref 38–126)
ALT: 12 U/L — AB (ref 14–54)
AST: 14 U/L — ABNORMAL LOW (ref 15–41)
Albumin: 4.4 g/dL (ref 3.5–5.0)
Anion gap: 6 (ref 5–15)
BUN: 8 mg/dL (ref 6–20)
CO2: 27 mmol/L (ref 22–32)
CREATININE: 0.93 mg/dL (ref 0.44–1.00)
Calcium: 8.9 mg/dL (ref 8.9–10.3)
Chloride: 103 mmol/L (ref 101–111)
GFR calc Af Amer: >60 mL/min (ref >60)
GFR calc non Af Amer: >60 mL/min (ref >60)
Glucose, Bld: 108 mg/dL — ABNORMAL HIGH (ref 65–99)
Potassium: 3.4 mmol/L — ABNORMAL LOW (ref 3.5–5.1)
SODIUM: 136 mmol/L (ref 135–145)
Total Bilirubin: 0.5 mg/dL (ref 0.3–1.2)
Total Protein: 7.7 g/dL (ref 6.5–8.1)

## 2015-04-02 LAB — ACETAMINOPHEN LEVEL: Acetaminophen (Tylenol), Serum: <10 ug/mL — ABNORMAL LOW (ref 10–30)

## 2015-04-02 LAB — CBC
HCT: 42.1 % (ref 36.0–46.0)
Hemoglobin: 13.8 g/dL (ref 12.0–15.0)
MCH: 28.6 pg (ref 26.0–34.0)
MCHC: 32.8 g/dL (ref 30.0–36.0)
MCV: 87.3 fL (ref 78.0–100.0)
PLATELETS: 277 10*3/uL (ref 150–400)
RBC: 4.82 MIL/uL (ref 3.87–5.11)
RDW: 12.9 % (ref 11.5–15.5)
WBC: 7.8 10*3/uL (ref 4.0–10.5)

## 2015-04-02 LAB — SALICYLATE LEVEL: Salicylate Lvl: <4 mg/dL (ref 2.8–30.0)

## 2015-04-02 LAB — ETHANOL: Alcohol, Ethyl (B): <5 mg/dL (ref <5)

## 2015-04-02 MED ORDER — SODIUM CHLORIDE 0.9 % IV SOLN
1000.0000 mL | Freq: Once | INTRAVENOUS | Status: AC
  Administered 2015-04-02: 1000 mL via INTRAVENOUS

## 2015-04-02 NOTE — ED Notes (Signed)
Spoke with Alice from poison control and ingestion of meds can cause tachycardia, gi upset, and seizures. She recommended ekg, iv fluid, tylenol level and no charcoal at this time. We are to observe for 6 hours.

## 2015-04-02 NOTE — ED Notes (Signed)
Friends informed only 1 visitor back at this time. Security took friends out & had removed personal items. Friends advised if pt becomes psych pt visiting policy will be put into effect.

## 2015-04-02 NOTE — ED Provider Notes (Signed)
This chart was scribed for Monica Maw Jayce Kainz, DO by Budd Palmer, ED Scribe. This patient was seen in room APA17/APA17 and the patient's care was started at 11:14 PM.   TIME SEEN: 11:14 PM   CHIEF COMPLAINT: Ingestion  HPI: Monica Patel is a 24 y.o. female with a PMHx of hypothyroidism, Bipolar Disorder and Depression who presents to the Emergency Department complaining of ingestion of 15 Lamotrigine (100 mg) to harm herself between 9:30 PM and 10:30 PM tonight. She denies taking any other drugs or alcohol with it. She denies previous suicide attempts and states she was not trying to kill herself today. She also reports chest pain (resolved). Per sister she is also on Synthroid, Xanax, and Celexa. Her psychiatrist is Dr Tenny Craw. Sister denies a hx of suicide attempts.   ROS: See HPI Constitutional: no fever  Eyes: no drainage  ENT: no runny nose   Cardiovascular:  chest pain  Resp: no SOB  GI: no vomiting GU: no dysuria Integumentary: no rash  Allergy: no hives  Musculoskeletal: no leg swelling  Neurological: slurred speech ROS otherwise negative  PAST MEDICAL HISTORY/PAST SURGICAL HISTORY:  Past Medical History  Diagnosis Date  . Anemia   . Infection     UTI  . Abnormal Pap smear     colpo  . Depression   . Mania   . Thyroid disease   . 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 Genital herpes   . Anxiety   . Bipolar 1 disorder     MEDICATIONS:  Prior to Admission medications   Medication Sig Start Date End Date Taking? Authorizing Provider  ALPRAZolam Prudy Feeler) 1 MG tablet Take 1 tablet (1 mg total) by mouth 3 (three) times daily as needed for anxiety. 03/14/15 03/13/16  Myrlene Broker, MD  carbamazepine (EQUETRO) 200 MG CP12 12 hr capsule Take 1 capsule (200 mg total) by mouth 2 (two) times daily. 03/14/15   Myrlene Broker, MD  citalopram (CELEXA) 20 MG tablet Take one twice a day 03/14/15   Myrlene Broker, MD  levothyroxine (SYNTHROID, LEVOTHROID) 75 MCG tablet Take 75 mcg by mouth daily before  breakfast.    Historical Provider, MD    ALLERGIES:  No Known Allergies  SOCIAL HISTORY:  Social History  Substance Use Topics  . Smoking status: Current Every Day Smoker -- 0.50 packs/day for 6 years    Types: Cigarettes  . Smokeless tobacco: Never Used  . Alcohol Use: No    FAMILY HISTORY: Family History  Problem Relation Age of Onset  . Other Neg Hx   . Anxiety disorder Father   . Depression Father   . Anxiety disorder Sister   . Depression Sister   . Anxiety disorder Sister   . OCD Sister   . Depression Mother     EXAM: BP 107/67 mmHg  Pulse 89  Temp(Src) 98.6 F (37 C) (Oral)  Resp 20  Ht 5\' 6"  (1.676 m)  Wt 158 lb (71.668 kg)  BMI 25.51 kg/m2  SpO2 100%  LMP 03/31/2015 CONSTITUTIONAL: Alert and oriented and responds appropriately to questions but is difficult to understand. In no distress, smiling inappropriately, slurred speech HEAD: Normocephalic EYES: Conjunctivae clear, PERRL ENT: normal nose; no rhinorrhea; moist mucous membranes; pharynx without lesions noted NECK: Supple, no meningismus, no LAD  CARD: RRR; S1 and S2 appreciated; no murmurs, no clicks, no rubs, no gallops RESP: Normal chest excursion without splinting or tachypnea; breath sounds clear and equal bilaterally; no wheezes, no  rhonchi, no rales, no hypoxia or respiratory distress, speaking full sentences ABD/GI: Normal bowel sounds; non-distended; soft, non-tender, no rebound, no guarding, no peritoneal signs BACK:  The back appears normal and is non-tender to palpation, there is no CVA tenderness EXT: Normal ROM in all joints; non-tender to palpation; no edema; normal capillary refill; no cyanosis, no calf tenderness or swelling    SKIN: Normal color for age and race; warm NEURO: Very slurred speech, but no aphasia. Moves all extremities equally, sensation to light touch intact diffusely, cranial nerves II through XII intact PSYCH: Pt denies to me that this was a suicide attempt but told  triage staff initially that she was doing this to harm herself  MEDICAL DECISION MAKING: Patient here with intentional overdose. Discussed with poison control who recommended observation for 6 hours. Will obtain labs, urine, EKG. She is currently hemodynamically stable. Once medically cleared, patient will need psychiatric evaluation.  Pt's sister and grandmother have been updated.  ED PROGRESS: 03:30 AM  Patient's labs are unremarkable. Urine drug screen is positive for benzodiazepines. She is prescribed Xanax. She is not pregnant. Patient has had some systolic blood pressures in the 90s but no tachycardia. On review of previous records, this is chronic for patient. She has received 2 L of IV fluids in the emergency department.  Patient is now more awake. Her slurred speech has improved. She states that she took this medication because she was sad. Poison control has been contacted and state patient is now medically clear. We'll consult TTS.  4:40 AM  D/w TTS - they will seek placement for patient.   EKG Interpretation  Date/Time:  Tuesday April 02 2015 22:50:37 EDT Ventricular Rate:  87 PR Interval:  140 QRS Duration: 79 QT Interval:  372 QTC Calculation: 447 R Axis:   65 Text Interpretation:  Sinus rhythm No significant change since last tracing Confirmed by Shanta Dorvil,  DO, Quy Lotts [phone]) on 04/02/2015 11:12:33 PM      I personally performed the services described in this documentation, which was scribed in my presence. The recorded information has been reviewed and is accurate.   Monica Maw Monica Durell, DO 04/03/15 [phone]
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
## 2015-04-03 ENCOUNTER — Inpatient Hospital Stay (HOSPITAL_COMMUNITY)
Admission: AD | Admit: 2015-04-03 | Discharge: 2015-04-07 | DRG: 885 | Disposition: A | Discharge status: Home / Self Care | Payer: 59 | Source: Intra-hospital | Attending: Psychiatry | Admitting: Psychiatry

## 2015-04-03 ENCOUNTER — Encounter (HOSPITAL_COMMUNITY): Payer: Self-pay

## 2015-04-03 DIAGNOSIS — T426X2A Poisoning by other antiepileptic and sedative-hypnotic drugs, intentional self-harm, initial encounter: Secondary | ICD-10-CM | POA: Diagnosis not present

## 2015-04-03 DIAGNOSIS — F3160 Bipolar disorder, current episode mixed, unspecified: Principal | ICD-10-CM | POA: Diagnosis present

## 2015-04-03 DIAGNOSIS — E039 Hypothyroidism, unspecified: Secondary | ICD-10-CM | POA: Diagnosis present

## 2015-04-03 DIAGNOSIS — F17210 Nicotine dependence, cigarettes, uncomplicated: Secondary | ICD-10-CM | POA: Diagnosis present

## 2015-04-03 DIAGNOSIS — T50992A Poisoning by other drugs, medicaments and biological substances, intentional self-harm, initial encounter: Secondary | ICD-10-CM | POA: Diagnosis not present

## 2015-04-03 DIAGNOSIS — F3162 Bipolar disorder, current episode mixed, moderate: Secondary | ICD-10-CM | POA: Diagnosis not present

## 2015-04-03 DIAGNOSIS — T1491XA Suicide attempt, initial encounter: Secondary | ICD-10-CM | POA: Diagnosis present

## 2015-04-03 DIAGNOSIS — T1491 Suicide attempt: Secondary | ICD-10-CM | POA: Diagnosis not present

## 2015-04-03 LAB — RAPID URINE DRUG SCREEN, HOSP PERFORMED
AMPHETAMINES: NOT DETECTED
BARBITURATES: NOT DETECTED
Benzodiazepines: POSITIVE — AB
COCAINE: NOT DETECTED
OPIATES: NOT DETECTED
TETRAHYDROCANNABINOL: NOT DETECTED

## 2015-04-03 LAB — TSH: TSH: 2.847 u[IU]/mL (ref 0.350–4.500)

## 2015-04-03 LAB — PREGNANCY, URINE: Preg Test, Ur: NEGATIVE

## 2015-04-03 LAB — T4, FREE: FREE T4: 1.19 ng/dL — AB (ref 0.61–1.12)

## 2015-04-03 MED ORDER — ALPRAZOLAM 0.5 MG PO TABS
1.0000 mg | ORAL_TABLET | Freq: Three times a day (TID) | ORAL | Status: DC | PRN
  Administered 2015-04-03: 1 mg via ORAL
  Filled 2015-04-03: qty 2

## 2015-04-03 MED ORDER — SODIUM CHLORIDE 0.9 % IV BOLUS (SEPSIS)
1000.0000 mL | Freq: Once | INTRAVENOUS | Status: AC
  Administered 2015-04-03: 1000 mL via INTRAVENOUS

## 2015-04-03 MED ORDER — CITALOPRAM HYDROBROMIDE 20 MG PO TABS
20.0000 mg | ORAL_TABLET | Freq: Two times a day (BID) | ORAL | Status: DC
  Administered 2015-04-03: 20 mg via ORAL
  Filled 2015-04-03 (×5): qty 1

## 2015-04-03 MED ORDER — LEVOTHYROXINE SODIUM 75 MCG PO TABS
75.0000 ug | ORAL_TABLET | Freq: Every day | ORAL | Status: DC
  Administered 2015-04-04 – 2015-04-07 (×4): 75 ug via ORAL
  Filled 2015-04-03 (×9): qty 1

## 2015-04-03 MED ORDER — NICOTINE POLACRILEX 2 MG MT GUM
2.0000 mg | CHEWING_GUM | OROMUCOSAL | Status: DC | PRN
  Administered 2015-04-03 – 2015-04-07 (×13): 2 mg via ORAL
  Filled 2015-04-03 (×14): qty 1

## 2015-04-03 MED ORDER — CITALOPRAM HYDROBROMIDE 20 MG PO TABS: 20.0000 mg | ORAL_TABLET | Freq: Two times a day (BID) | ORAL | Status: DC

## 2015-04-03 MED ORDER — CITALOPRAM HYDROBROMIDE 20 MG PO TABS
20.0000 mg | ORAL_TABLET | Freq: Two times a day (BID) | ORAL | Status: DC
  Administered 2015-04-03 – 2015-04-04 (×2): 20 mg via ORAL
  Filled 2015-04-03 (×5): qty 1

## 2015-04-03 MED ORDER — ALUM & MAG HYDROXIDE-SIMETH 200-200-20 MG/5ML PO SUSP: 30.0000 mL | ORAL | Status: DC | PRN

## 2015-04-03 MED ORDER — LEVOTHYROXINE SODIUM 50 MCG PO TABS
75.0000 ug | ORAL_TABLET | Freq: Every day | ORAL | Status: DC
  Administered 2015-04-03: 75 ug via ORAL
  Filled 2015-04-03: qty 2

## 2015-04-03 MED ORDER — ALPRAZOLAM 1 MG PO TABS
1.0000 mg | ORAL_TABLET | Freq: Three times a day (TID) | ORAL | Status: DC | PRN
  Administered 2015-04-04: 1 mg via ORAL
  Filled 2015-04-03: qty 1

## 2015-04-03 MED ORDER — MAGNESIUM HYDROXIDE 400 MG/5ML PO SUSP: 30.0000 mL | Freq: Every day | ORAL | Status: DC | PRN

## 2015-04-03 NOTE — Progress Notes (Signed)
Pt accepted to [facility] bed 405-2 per Tanna Savoy, by Dr. Jama Flavors. Admission is voluntary, pt can arrive anytime.   Spoke with [facility] re: pt's placement.  Ilean Skill, MSW, LCSW Clinical Social Work, Disposition  04/03/2015 [phone]

## 2015-04-03 NOTE — ED Notes (Signed)
Mother called Monica Patel advised was 4 hours away. Left phone number to contact if needed before she arrives. [phone]   Mother informed visitors have been allowed during observation period & once placed in psych evaul then visitor policy will be in place.

## 2015-04-03 NOTE — ED Notes (Signed)
TTS set up in room.  

## 2015-04-03 NOTE — ED Notes (Signed)
TTS complete 

## 2015-04-03 NOTE — ED Provider Notes (Signed)
Resting comfortably this morning. No overnight problems per nursing staff. Continue pysch placement search.  Filed Vitals:   04/03/15 0338  BP: 96/47  Pulse: 86  Temp:   Resp: 18     Gilda Crease, MD 04/03/15 [phone]

## 2015-04-03 NOTE — ED Notes (Signed)
Pt resting calmly w/ eyes closed. Rise & fall of the chest noted. Sitter remains in sight of pt. Bed in low position, side rails up x2. NAD noted at this time.    

## 2015-04-03 NOTE — ED Notes (Signed)
Pt provided crackers, peanut butter & water.

## 2015-04-03 NOTE — ED Notes (Signed)
AC called to pick up pt medications to carry up to pharmacy for storage.

## 2015-04-03 NOTE — ED Notes (Signed)
[facility] called for transport to [facility]. 

## 2015-04-03 NOTE — ED Notes (Signed)
Mother at bedside with pt

## 2015-04-03 NOTE — BH Assessment (Signed)
Reviewed ED notes prior to initiating assessment. Per notes pt took and intentional overdose because she was sad, denies this was suicide attempt. Per Dr. Jolyn Nap note pt is now medically cleared for assessment.   Pt is followed by Dr. Tenny Craw in [place] for Bipolar Disorder.   Requested cart be placed with pt for assessment.   Assessment to commence shortly.    Clista Bernhardt, [facility] Triage Specialist 04/03/2015 3:36 AM

## 2015-04-03 NOTE — ED Notes (Signed)
Per Harriett Sine from [facility], in placement treatment is recommended. No beds at [facility] at this time. Will seek placement.

## 2015-04-03 NOTE — ED Notes (Signed)
Pt talking on phone with her mother.  Denies any needs.

## 2015-04-03 NOTE — BH Assessment (Addendum)
Tele Assessment Note   Monica Patel is an 24 y.o. female. Currently being followed by Dr. Tenny Craw and Dr. Kieth Brightly at the [facility] for psychiatry and therapy. Pt has been dx with bipolar I disorder and anxiety. She presents to ED today after taking 15 lamictal. Pt denies this was a suicide attempt. She states her dad was yelling at her and she got upset. She reports she was sad and took the lamictal in hopes of trying to feel better. Pt denied recent depression or SI, however this is not consistent with what she reported at her last psychiatric appointment. Per Dr. Tenny Craw' note on 02/2015: She states that she is not doing well. She is increasingly angry and irritable. Her mood has been low. She states that she feels "rage inside". She doesn't feel that the Lamictal is working anymore for her bipolar disorder. I told her we could try carbamazepine XL as this is been FDA approved for treatment of bipolar and she agrees. At time she has fleeting suicidal ideation but claims she would never act on it because of her son. He has been very anxious and having frequent panic attacks and the Xanax at 0.5 mg 3 times a day is not helpful. She recently lost a job but she was getting exposed to dust they're getting sick so she thinks is just as well. She saw her primary doctor Friday and had a CBC which was normal   At time of assessment camera was not working, so pt could not be seen, but could be heard clearly. Pt was oriented times 4. Speech was logical and coherent. Pt denies self harm, HI, SA, or AVH. She also denies SI, but took significant overdose prior to arrival. Pt denies recent stressors besides arguing with her father prior to overdose. Pt reports it was an impulsive behavior and denied SI prior to taking pills.   Pt denies recent depressive episode, noting she has not been depressed at all for some weeks. Pt has hx of bipolar with manic and depressive episodes. Pt denies recent changes in sleep or  eating. Reports tearfulness earlier because she had to leave her son to come to hospital.   Pt reports hx of anxiety. She reports hx of panic attacks, with none in the past couple of weeks. Pt reports she tends to stay worried most of the time.   Pt denies SA. Denies family hx of SA, MH, or SI concerns.   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 At her last psychiatric appointment she had just lost a job. Pt reports she lives alone with her 62 y.o son, attends therapy, takes medications as prescribed, and talks to her mother when she needs support.   Axis I:  296.43 Bipolar I Disorder, most recent episode mixed   300.00 Unspecified Anxiety Disorder  Past Medical History:  Past Medical History  Diagnosis Date  . Anemia   . Infection     UTI  . Abnormal Pap smear     colpo  . Depression   . Mania   . Thyroid disease   . 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 Genital herpes   . Anxiety   . Bipolar 1 disorder     Past Surgical History  Procedure Laterality Date  . No past surgeries    . Dilation and evacuation  06/04/2012    Procedure: DILATATION AND EVACUATION;  Surgeon: Bing Plume, MD;  Location: [facility];  Service: Gynecology;  Laterality: N/A;    Family History:  Family History  Problem Relation Age of Onset  . Other Neg Hx   . Anxiety disorder Father   . Depression Father   . Anxiety disorder Sister   . Depression Sister   . Anxiety disorder Sister   . OCD Sister   . Depression Mother     Social History:  reports that she has been smoking Cigarettes.  She has a 3 pack-year smoking history. She has never used smokeless tobacco. She reports that she does not drink alcohol or use illicit drugs.  Additional Social History:  Alcohol / Drug Use Pain Medications: See PTA Prescriptions: See PTA, reports she is usually compliant Over the Counter: See PTA History of alcohol / drug use?: No history of alcohol / drug abuse Longest period of sobriety (when/how  long): NA Negative Consequences of Use:  (NA)  CIWA: CIWA-Ar BP: (!) 96/47 mmHg Pulse Rate: 86 COWS:    PATIENT STRENGTHS: (choose at least two) Supportive family/friends Work skills  Allergies: No Known Allergies  Home Medications:  (Not in a hospital admission)  OB/GYN Status:  Patient's last menstrual period was 03/31/2015.  General Assessment Data Location of Assessment: [facility] TTS Assessment: In system Is this a Tele or Face-to-Face Assessment?: Tele Assessment Is this an Initial Assessment or a Re-assessment for this encounter?: Initial Assessment Marital status: Single Is patient pregnant?: No Pregnancy Status: No Living Arrangements: Children (31 y.o son) Can pt return to current living arrangement?: Yes Admission Status: Voluntary Is patient capable of signing voluntary admission?: Yes Referral Source: Self/Family/Friend Insurance type: [employer]     Crisis Care Plan Living Arrangements: Children (4 y.o son) Name of Psychiatrist: Dr. Tenny Craw Name of Therapist: Dr.  Kieth Brightly  Education Status Is patient currently in school?: No Current Grade: NA Highest grade of school patient has completed: some college Name of school: NA Contact person: NA  Risk to self with the past 6 months Suicidal Ideation:  (denies but took overdose prior to arrival ) Has patient been a risk to self within the past 6 months prior to admission? : No Suicidal Intent: Yes-Currently Present Has patient had any suicidal intent within the past 6 months prior to admission? : No Is patient at risk for suicide?: No Suicidal Plan?: Yes-Currently Present Has patient had any suicidal plan within the past 6 months prior to admission? : Yes Specify Current Suicidal Plan: took overdose prior to arrival  Access to Means: Yes Specify Access to Suicidal Means: medication What has been your use of drugs/alcohol within the last 12 months?: none, tobacco user Previous Attempts/Gestures: No How  many times?: 0 Other Self Harm Risks: none Triggers for Past Attempts: Unknown Intentional Self Injurious Behavior: None Family Suicide History: No Recent stressful life event(s): Conflict (Comment) (argument with father tonight) Persecutory voices/beliefs?: No Depression: No (reports was sad tonight but not lately) Depression Symptoms:  (reports was sad earlier) Substance abuse history and/or treatment for substance abuse?: No Suicide prevention information given to non-admitted patients: Not applicable  Risk to Others within the past 6 months Homicidal Ideation: No Does patient have any lifetime risk of violence toward others beyond the six months prior to admission? : No Thoughts of Harm to Others: No Current Homicidal Intent: No Current Homicidal Plan: No Access to Homicidal Means: No Identified Victim: none History of harm to others?: No Assessment of Violence: None Noted Violent Behavior Description: none Does patient have access to weapons?: No  Criminal Charges Pending?: No Does patient have a court date: No Is patient on probation?: No  Psychosis Hallucinations: None noted Delusions: None noted  Mental Status Report Appearance/Hygiene: Unable to Assess (camera not working on monitor) Eye Contact: Unable to Assess Motor Activity: Unable to assess Speech: Logical/coherent Level of Consciousness: Alert Mood: Pleasant Affect: Appropriate to circumstance (minimized overdose) Anxiety Level: Moderate Thought Processes: Coherent, Relevant Judgement: Impaired Orientation: Person, Situation, Place, Time Obsessive Compulsive Thoughts/Behaviors: None  Cognitive Functioning Concentration: Normal Memory: Recent Intact, Remote Intact IQ: Average Insight: Fair Impulse Control: Poor Appetite: Good Weight Loss: 0 Weight Gain: 0 Sleep: No Change Total Hours of Sleep: 8 Vegetative Symptoms: None  ADLScreening [facility]) Patient's cognitive ability  adequate to safely complete daily activities?: Yes Patient able to express need for assistance with ADLs?: Yes Independently performs ADLs?: Yes (appropriate for developmental age)  Prior Inpatient Therapy Prior Inpatient Therapy: No Prior Therapy Dates: NA Prior Therapy Facilty/Provider(s): NA Reason for Treatment: NA  Prior Outpatient Therapy Prior Outpatient Therapy: Yes Prior Therapy Dates: ongoing Prior Therapy Facilty/Provider(s): Dr. Tenny Craw and Dr. Robley Fries in [place]  Reason for Treatment: bipolar, medication management, therapy  Does patient have an ACCT team?: No Does patient have Intensive In-House Services?  : No Does patient have [facility] services? : No Does patient have P4CC services?: No  ADL Screening (condition at time of admission) Patient's cognitive ability adequate to safely complete daily activities?: Yes Is the patient deaf or have difficulty hearing?: No Does the patient have difficulty seeing, even when wearing glasses/contacts?: No Does the patient have difficulty concentrating, remembering, or making decisions?: No Patient able to express need for assistance with ADLs?: Yes Does the patient have difficulty dressing or bathing?: No Independently performs ADLs?: Yes (appropriate for developmental age) Does the patient have difficulty walking or climbing stairs?: No Weakness of Legs: None Weakness of Arms/Hands: None  Home Assistive Devices/Equipment Home Assistive Devices/Equipment: None    Abuse/Neglect Assessment (Assessment to be complete while patient is alone) Physical Abuse: Denies Verbal Abuse: Denies Sexual Abuse: Denies Exploitation of patient/patient's resources: Denies Self-Neglect: Denies Values / Beliefs Cultural Requests During Hospitalization: None Spiritual Requests During Hospitalization: None   Advance Directives (For Healthcare) Does patient have an advance directive?: No Would patient like information on creating an  advanced directive?: No - patient declined information    Additional Information 1:1 In Past 12 Months?: No CIRT Risk: No Elopement Risk: No Does patient have medical clearance?: Yes     Disposition:  Per Donell Sievert, PA pt meets inpt criteria. No current [facility] beds available TTS to seek placement. Dr. Elesa Massed in agreement. Christen Bame Rn will inform pt.   Clista Bernhardt, [facility] Triage Specialist 04/03/2015 4:16 AM  Disposition Initial Assessment Completed for this Encounter: Yes Disposition of Patient: Inpatient treatment program Type of inpatient treatment program: Adult  Resa Miner 04/03/2015 4:13 AM

## 2015-04-03 NOTE — ED Notes (Signed)
Poison control called & cleared case. Dr Elesa Massed in to see pt & informed of consult & treatment plan w/ [facility]. Pt removed from monitor. Family inform psych visiting policy will now be in place.

## 2015-04-03 NOTE — Progress Notes (Signed)
Monica Patel was admitted from [facility] ER for suicide attempt to take 15 Lamictal 100 mg pills.  She states that she didn't want to die but "I was mad because my Dad was fussing at me for letting my sister use Heroin."  She couldn't take her father yelling  at her and I just wanted to forget.  She denies current suicidal thoughts and she is able to contract for safety on the unit.  She denies any auditory or visual hallucinations.  She denies any physical complaints.  She appears to be in no physical distress.  She has a primary care doctor that she sees on a regular basis.  She has psychiatric provider but "I cannot remember who I see."  See current medication list.  She reports that she smokes about a pack per day and is wanting to have the gum.  Oriented to the unit. Admission packet given.  No belongings to be placed in the locker.

## 2015-04-03 NOTE — ED Notes (Signed)
Per Aundra Millet at [facility], pt has been accepted by Dr. Jama Flavors to room 405 bed 2 at [facility].

## 2015-04-03 NOTE — Tx Team (Signed)
Initial Interdisciplinary Treatment Plan   PATIENT STRESSORS: Loss of friend to suicide 4 years ago   PATIENT STRENGTHS: Active sense of humor Capable of independent living   PROBLEM LIST: Problem List/Patient Goals Date to be addressed Date deferred Reason deferred Estimated date of resolution  Anxiety 04/03/15     Suicide Attempt 04/03/15     "I want to learn how to handle my dad's criticism  better" 04/03/15     Learn better coping skills 04/03/15                                    DISCHARGE CRITERIA:  Improved stabilization in mood, thinking, and/or behavior Need for constant or close observation no longer present  PRELIMINARY DISCHARGE PLAN: Outpatient therapy  PATIENT/FAMIILY INVOLVEMENT: This treatment plan has been presented to and reviewed with the patient, Monica Patel.  The patient and family have been given the opportunity to ask questions and make suggestions.  Norm Parcel Lenox Bink 04/03/2015, 6:25 PM

## 2015-04-04 ENCOUNTER — Encounter (HOSPITAL_COMMUNITY): Payer: Self-pay | Admitting: Registered Nurse

## 2015-04-04 DIAGNOSIS — T1491 Suicide attempt: Secondary | ICD-10-CM

## 2015-04-04 DIAGNOSIS — F3162 Bipolar disorder, current episode mixed, moderate: Secondary | ICD-10-CM

## 2015-04-04 DIAGNOSIS — T426X2A Poisoning by other antiepileptic and sedative-hypnotic drugs, intentional self-harm, initial encounter: Secondary | ICD-10-CM

## 2015-04-04 MED ORDER — CITALOPRAM HYDROBROMIDE 40 MG PO TABS
40.0000 mg | ORAL_TABLET | Freq: Every day | ORAL | Status: DC
  Administered 2015-04-05 – 2015-04-07 (×3): 40 mg via ORAL
  Filled 2015-04-04 (×5): qty 1

## 2015-04-04 NOTE — Progress Notes (Signed)
D: Monica Patel has been attending groups and interacting appropriately on the unit. She has been compliant with meds and has voiced few needs, though she has been encouraged to do so. She denies SI/HI/AVH and pain. She reports irritability on her self-inventory but appears able to manage her feelings. She also reported depression 3, hopelessness 3, and anxiety 2.  A: Meds given as ordered, including PRN nicotine gum. Q15 safety checks maintained. Support/encouragement offered. R: Pt remains free from harm and continues with treatment. Will continue to monitor for needs/safety.

## 2015-04-04 NOTE — Progress Notes (Signed)
Pt reports she was admitted this afternoon after making a suicidal gesture.  She denies SI at this time, saying that she does not want to kill herself, because she has a young son to raise.  She is depressed about losing her job recently, but not to the point of killing herself.  She wants to learn how to cope with her father and his criticism of her with out letting it get the best of her.  She knows she needs to make the best of her stay here.  Pt denies HI/AVH.  Pt has been pleasant and cooperative with staff this evening.  Pt was encouraged to make her needs known to staff.  Support and encouragement offered.  Discharge plans are in process.  Safety maintained with q15 minute checks.

## 2015-04-04 NOTE — Progress Notes (Signed)
Adult Psychoeducational Group Note  Date:  04/04/2015 Time:  1:08 PM  Group Topic/Focus:  Building Self Esteem:   The Focus of this group is helping patients become aware of the effects of self-esteem on their lives, the things they and others do that enhance or undermine their self-esteem, seeing the relationship between their level of self-esteem and the choices they make and learning ways to enhance self-esteem.  Participation Level:  Active  Participation Quality:  Attentive  Affect:  Appropriate  Cognitive:  Appropriate  Insight: Good  Engagement in Group:  Engaged  Modes of Intervention:  Discussion  Additional Comments:  Pt talked about the people whom she felt made up her support system.  Pt discussed the coping that had helped her in the past such as talking to loved ones on the phone and walking around thinking.  Pt discussed that she felt as if she was a burden on her loved ones and she said she wanted to make some new friends.  Pt gave words of encouragement to her peers whom complained about their family members.  Monica Patel Monica Patel 04/04/2015, 1:08 PM

## 2015-04-04 NOTE — Tx Team (Addendum)
Interdisciplinary Treatment Plan Update (Adult) Date: 04/04/2015    Time Reviewed: 9:30 AM  Progress in Treatment: Attending groups: Continuing to assess, patient new to milieu Participating in groups: Continuing to assess, patient new to milieu Taking medication as prescribed: Yes Tolerating medication: Yes Family/Significant other contact made: No, CSW assessing for appropriate contacts Patient understands diagnosis: Yes Discussing patient identified problems/goals with staff: Yes Medical problems stabilized or resolved: Yes Denies suicidal/homicidal ideation: Yes Issues/concerns per patient self-inventory: Yes Other:  New problem(s) identified: N/A  Discharge Plan or Barriers: CSW continuing to assess, patient new to milieu.  Reason for Continuation of Hospitalization:  Depression Anxiety Medication Stabilization   Comments: N/A  Estimated length of stay: 3-5 days   Patient is a 24 year old female admitted for overdose on Lamictal. Patient follows up with [facility] for outpatient services. Patient will benefit from crisis stabilization, medication evaluation, group therapy, and psycho education in addition to case management for discharge planning. Patient and CSW reviewed pt's identified goals and treatment plan. Pt verbalized understanding and agreed to treatment plan.     Review of initial/current patient goals per problem list:  1. Goal(s): Patient will participate in aftercare plan   Met: Yes   Target date: 3-5 days post admission date   As evidenced by: Patient will participate within aftercare plan AEB aftercare provider and housing plan at discharge being identified.   04/04/2015: Goal not met: CSW assessing for appropriate referrals for pt and will have follow up secured prior to d/c.  9/9: Patient plans to return home to follow up with outpatient services.     2. Goal (s): Patient will exhibit decreased depressive symptoms and suicidal  ideations.   Met: Yes   Target date: 3-5 days post admission date   As evidenced by: Patient will utilize self rating of depression at 3 or below and demonstrate decreased signs of depression or be deemed stable for discharge by MD.   04/04/2015: Goal not met: Pt presents with flat affect and depressed mood.  Pt admitted with depression rating of 10.  Pt to show decreased sign of depression and a rating of 3 or less before d/c.    9/9: Goal met: Patient rates depression at 3 today.    Attendees: Patient:    Family:    Physician: Dr. Parke Poisson; Dr. Sabra Heck 04/04/2015 9:30 AM  Nursing: Eulogio Bear, Grayland Ormond, Janann August , RN 04/04/2015 9:30 AM  Clinical Social Worker:  04/04/2015 9:30 AM  Other: Maxie Better, LCSWA 04/04/2015 9:30 AM  Other: Lucinda Dell, Beverly Sessions Liaison 04/04/2015 9:30 AM  Other: Lars Pinks, Case Manager 04/04/2015 9:30 AM  Other: Tilford Pillar Rankin, NP 04/04/2015 9:30 AM  Other:                Scribe for Treatment Team:  Tilden Fossa, MSW, [place] [phone]

## 2015-04-04 NOTE — Progress Notes (Signed)
Pt goal today was to deal with her anger better pt stated she felt happy when she achieved her goal tomorrow pt wants to work on her anger issues.

## 2015-04-04 NOTE — [facility] Group Notes (Signed)
[facility] Group Notes:  (Nursing/MHT/Case Management/Adjunct)  Date:  04/04/2015  Time:  0900  Type of Therapy:  Nurse Education  Participation Level:  Active  Participation Quality:  Appropriate  Affect:  Blunted  Cognitive:  Appropriate and Oriented  Insight:  Appropriate  Engagement in Group:  Engaged  Modes of Intervention:  Discussion, Education and Support  Summary of Progress/Problems: Monica Patel shared that she hoped to evaluate her relationship with her significant other and whether he is appropriate to have in her son's life. Encouraged her to journal.  Maurine Simmering 04/04/2015, 9:33 AM

## 2015-04-04 NOTE — [facility] Group Notes (Signed)
[facility] LCSW Group Therapy 04/04/2015 1:15 PM Type of Therapy: Group Therapy Participation Level: Active  Participation Quality: Attentive, Sharing and Supportive  Affect: Depressed and Flat  Cognitive: Alert and Oriented  Insight: Developing/Improving and Engaged  Engagement in Therapy: Developing/Improving and Engaged  Modes of Intervention: Activity, Clarification, Confrontation, Discussion, Education, Exploration, Limit-setting, Orientation, Problem-solving, Rapport Building, Dance movement psychotherapist, Socialization and Support  Summary of Progress/Problems: Patient was attentive and engaged with speaker from [employer]. Patient was attentive to speaker while they shared their story of dealing with mental health and overcoming it. Patient expressed interest in their programs and services and received information on their agency. Patient processed ways they can relate to the speaker.   [name], MSW, [employer] Clinical Social Worker [facility] [phone]

## 2015-04-04 NOTE — H&P (Addendum)
Psychiatric Admission Assessment Adult  Patient Identification: Monica Patel MRN:  [id number] Date of Evaluation:  04/04/2015 Chief Complaint:   " I really don't know what happened " Principal Diagnosis:  Suicide attempt  Diagnosis:   Patient Active Problem List   Diagnosis Date Noted  . Suicide attempt [T14.91] 04/03/2015  . Bipolar 1 disorder, mixed, moderate [F31.62] 12/13/2013   History of Present Illness:: 24 year old female who recently impulsively overdosed on about 15 tablets of lamictal, which is prescribed to her. States her BF found out she had overdosed and brought her to the hospital. She states overdose was " not to hurt myself, but just to feel better ".  She denies any significant depression prior to her overdose and and in fact feels she was doing well on her medications. Overdose was impulsive , and she states the purpose of it was not to hurt herself " I was just nervous because my dad was blaming me for my sister using heroin.  He was trying to get me kicked out of my apartment ".  Elements:   Recent impulsive overdose in the context of acute psychosocial stressors. Denies /minimizes depression or SI. (+ ) chronic impulsivity and recent drug abuse .  Associated Signs/Symptoms: Depression Symptoms:  She denies any depression recently and does not endorse anhedonia, low energy, low self esteem or any sadness. Appetite and sleep have been good .  (Hypo) Manic Symptoms:  Does not endorse  Anxiety Symptoms:  Occasional panic attacks , usually related to work or financial stressors. Denies agoraphobia , denies  GAD symptoms.  Psychotic Symptoms:  Denies  PTSD Symptoms: Does not endorse  Total Time spent with patient: 45 minutes   Past Psychiatric History- no prior psychiatric admissions, denies history of suicide attempts, denies history of psychosis, episode of severe depression in 2013 related to a miscarriage .  She has been diagnosed with Bipolar Disorder in the past but  at this time is not endorsing any clear history of hypomania or mania. Denies history of violence, does not endorse PTSD. Has outpatient psychiatrist , Dr. Harrington Challenger. Also has a therapist, Dr. Sima Matas. She has been on lamictal, celexa for " a long time". She feels these medications help her . She has been on Xanax for two months . Denies abusing xanax .    Past Medical History: Smokes 1 PPD  Past Medical History  Diagnosis Date  . Anemia   . Infection     UTI  . Abnormal Pap smear     colpo  . Depression   . Mania   . Thyroid disease   . 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 Genital herpes   . Anxiety   . Bipolar 1 disorder     Past Surgical History  Procedure Laterality Date  . No past surgeries    . Dilation and evacuation  06/04/2012    Procedure: DILATATION AND EVACUATION;  Surgeon: Melina Schools, MD;  Location: [facility];  Service: Gynecology;  Laterality: N/A;   Family History:  Parents alive , live together, 2 sisters. Father has depression, PTSD. Sister has history of anxiety, depression, and is now abusing heroin. No history of suicides in family. Family History  Problem Relation Age of Onset  . Other Neg Hx   . Anxiety disorder Father   . Depression Father   . Anxiety disorder Sister   . Depression Sister   . Anxiety disorder Sister   . OCD Sister   . Depression  Mother    Social History: Single, has one son- 2 years old, who lives with her.  Has a BF, states that relationship is tense . Denies domestic violence. Currently employed . Denies legal issues .  History  Alcohol Use No     History  Drug Use No    Social History   Social History  . Marital Status: Single    Spouse Name: N/A  . Number of Children: N/A  . Years of Education: N/A   Social History Main Topics  . Smoking status: Current Every Day Smoker -- 1.00 packs/day for 6 years    Types: Cigarettes  . Smokeless tobacco: Never Used  . Alcohol Use: No  . Drug Use: No  . Sexual Activity: Yes    Birth Control/  Protection: Pill   Other Topics Concern  . None   Social History Narrative   Additional Social History:    Pain Medications: None Prescriptions: None Over the Counter: None History of alcohol / drug use?: No history of alcohol / drug abuse   Musculoskeletal: Strength & Muscle Tone: within normal limits Gait & Station: normal Patient leans: N/A  Psychiatric Specialty Exam: Physical Exam  Review of Systems  Constitutional: Negative.   HENT: Negative.   Eyes: Negative.   Respiratory: Negative.   Cardiovascular: Negative.   Gastrointestinal: Negative.   Genitourinary: Negative.   Musculoskeletal: Negative.   Skin: Negative.   Neurological: Negative.   Endo/Heme/Allergies: Negative.   Psychiatric/Behavioral: Positive for suicidal ideas and substance abuse. The patient is nervous/anxious.   all other systems negative   Blood pressure 110/64, pulse 73, temperature 98.1 F (36.7 C), temperature source Oral, resp. rate 18, height 5' 5"  (1.651 m), weight 154 lb (69.854 kg), last menstrual period 03/31/2015, SpO2 100 %.Body mass index is 25.63 kg/(m^2).  General Appearance: Fairly Groomed  Engineer, water::  Good  Speech:  Normal Rate  Volume:  Normal  Mood:  denies depression  Affect:  Appropriate  Thought Process:  Linear  Orientation:  Full (Time, Place, and Person)  Thought Content:  linear , well organized   Suicidal Thoughts:  No- denies any SI or self injurious ideations  Homicidal Thoughts:  No  Memory:  recent and remote grossly intact   Judgement:  Fair  Insight:  Fair  Psychomotor Activity:  Normal  Concentration:  Good  Recall:  Good  Fund of Knowledge:Good  Language: Good  Akathisia:  Negative  Handed:  Right  AIMS (if indicated):     Assets:  Desire for Improvement Physical Health Resilience  ADL's:   Fair   Cognition: WNL  Sleep:  Number of Hours: 6.5   Risk to Self: Is patient at risk for suicide?: Yes Risk to Others:   Prior Inpatient Therapy:    Prior Outpatient Therapy:    Alcohol Screening: 1. How often do you have a drink containing alcohol?: Never 9. Have you or someone else been injured as a result of your drinking?: No 10. Has a relative or friend or a doctor or another health worker been concerned about your drinking or suggested you cut down?: No Alcohol Use Disorder Identification Test Final Score (AUDIT): 0 Brief Intervention: AUDIT score less than 7 or less-screening does not suggest unhealthy drinking-brief intervention not indicated  Allergies:  No Known Allergies Lab Results:  Results for orders placed or performed during the hospital encounter of 04/02/15 (from the past 48 hour(s))  TSH     Status: None   Collection Time:  04/02/15 10:51 PM  Result Value Ref Range   TSH 2.847 0.350 - 4.500 uIU/mL  T4, free     Status: Abnormal   Collection Time: 04/02/15 10:51 PM  Result Value Ref Range   Free T4 1.19 (H) 0.61 - 1.12 ng/dL    Comment: Performed at [facility]  Comprehensive metabolic panel     Status: Abnormal   Collection Time: 04/02/15 10:56 PM  Result Value Ref Range   Sodium 136 135 - 145 mmol/L   Potassium 3.4 (L) 3.5 - 5.1 mmol/L   Chloride 103 101 - 111 mmol/L   CO2 27 22 - 32 mmol/L   Glucose, Bld 108 (H) 65 - 99 mg/dL   BUN 8 6 - 20 mg/dL   Creatinine, Ser 0.93 0.44 - 1.00 mg/dL   Calcium 8.9 8.9 - 10.3 mg/dL   Total Protein 7.7 6.5 - 8.1 g/dL   Albumin 4.4 3.5 - 5.0 g/dL   AST 14 (L) 15 - 41 U/L   ALT 12 (L) 14 - 54 U/L   Alkaline Phosphatase 87 38 - 126 U/L   Total Bilirubin 0.5 0.3 - 1.2 mg/dL   GFR calc non Af Amer >60 >60 mL/min   GFR calc Af Amer >60 >60 mL/min    Comment: (NOTE) The eGFR has been calculated using the CKD EPI equation. This calculation has not been validated in all clinical situations. eGFR's persistently <60 mL/min signify possible Chronic Kidney Disease.    Anion gap 6 5 - 15  Ethanol (ETOH)     Status: None   Collection Time: 04/02/15 10:56 PM  Result  Value Ref Range   Alcohol, Ethyl (B) <5 <5 mg/dL    Comment:        LOWEST DETECTABLE LIMIT FOR SERUM ALCOHOL IS 5 mg/dL FOR MEDICAL PURPOSES ONLY   Salicylate level     Status: None   Collection Time: 04/02/15 10:56 PM  Result Value Ref Range   Salicylate Lvl <5.1 2.8 - 30.0 mg/dL  Acetaminophen level     Status: Abnormal   Collection Time: 04/02/15 10:56 PM  Result Value Ref Range   Acetaminophen (Tylenol), Serum <10 (L) 10 - 30 ug/mL    Comment:        THERAPEUTIC CONCENTRATIONS VARY SIGNIFICANTLY. A RANGE OF 10-30 ug/mL MAY BE AN EFFECTIVE CONCENTRATION FOR MANY PATIENTS. HOWEVER, SOME ARE BEST TREATED AT CONCENTRATIONS OUTSIDE THIS RANGE. ACETAMINOPHEN CONCENTRATIONS >150 ug/mL AT 4 HOURS AFTER INGESTION AND >50 ug/mL AT 12 HOURS AFTER INGESTION ARE OFTEN ASSOCIATED WITH TOXIC REACTIONS.   CBC     Status: None   Collection Time: 04/02/15 10:56 PM  Result Value Ref Range   WBC 7.8 4.0 - 10.5 K/uL   RBC 4.82 3.87 - 5.11 MIL/uL   Hemoglobin 13.8 12.0 - 15.0 g/dL   HCT 42.1 36.0 - 46.0 %   MCV 87.3 78.0 - 100.0 fL   MCH 28.6 26.0 - 34.0 pg   MCHC 32.8 30.0 - 36.0 g/dL   RDW 12.9 11.5 - 15.5 %   Platelets 277 150 - 400 K/uL  Urine rapid drug screen (hosp performed) (Not at [facility])     Status: Abnormal   Collection Time: 04/03/15 12:08 AM  Result Value Ref Range   Opiates NONE DETECTED NONE DETECTED   Cocaine NONE DETECTED NONE DETECTED   Benzodiazepines POSITIVE (A) NONE DETECTED   Amphetamines NONE DETECTED NONE DETECTED   Tetrahydrocannabinol NONE DETECTED NONE DETECTED   Barbiturates NONE DETECTED  NONE DETECTED    Comment:        DRUG SCREEN FOR MEDICAL PURPOSES ONLY.  IF CONFIRMATION IS NEEDED FOR ANY PURPOSE, NOTIFY LAB WITHIN 5 DAYS.        LOWEST DETECTABLE LIMITS FOR URINE DRUG SCREEN Drug Class       Cutoff (ng/mL) Amphetamine      1000 Barbiturate      200 Benzodiazepine   762 Tricyclics       831 Opiates          300 Cocaine          300 THC               50   Pregnancy, urine     Status: None   Collection Time: 04/03/15 12:08 AM  Result Value Ref Range   Preg Test, Ur NEGATIVE NEGATIVE    Comment:        THE SENSITIVITY OF THIS METHODOLOGY IS >20 mIU/mL.    Current Medications: Current Facility-Administered Medications  Medication Dose Route Frequency Provider Last Rate Last Dose  . ALPRAZolam (XANAX) tablet 1 mg  1 mg Oral TID PRN Shuvon B Rankin, NP      . alum & mag hydroxide-simeth (MAALOX/MYLANTA) 200-200-20 MG/5ML suspension 30 mL  30 mL Oral Q4H PRN Shuvon B Rankin, NP      . citalopram (CELEXA) tablet 20 mg  20 mg Oral BID Shuvon B Rankin, NP   20 mg at 04/04/15 0745  . levothyroxine (SYNTHROID, LEVOTHROID) tablet 75 mcg  75 mcg Oral QAC breakfast Shuvon B Rankin, NP   75 mcg at 04/04/15 0616  . magnesium hydroxide (MILK OF MAGNESIA) suspension 30 mL  30 mL Oral Daily PRN Shuvon B Rankin, NP      . nicotine polacrilex (NICORETTE) gum 2 mg  2 mg Oral PRN Jenne Campus, MD   2 mg at 04/03/15 2218   PTA Medications: Prescriptions prior to admission  Medication Sig Dispense Refill Last Dose  . ALPRAZolam (XANAX) 1 MG tablet Take 1 tablet (1 mg total) by mouth 3 (three) times daily as needed for anxiety. 90 tablet 2 Past Week at Unknown time  . citalopram (CELEXA) 20 MG tablet Take one twice a day (Patient taking differently: Take 20 mg by mouth 2 (two) times daily. ) 180 tablet 2 04/02/2015 at Unknown time  . lamoTRIgine (LAMICTAL) 100 MG tablet Take 100 mg by mouth 2 (two) times daily.   04/02/2015 at Unknown time  . levothyroxine (SYNTHROID, LEVOTHROID) 75 MCG tablet Take 75 mcg by mouth daily before breakfast.   04/02/2015 at Unknown time  . [DISCONTINUED] carbamazepine (EQUETRO) 200 MG CP12 12 hr capsule Take 1 capsule (200 mg total) by mouth 2 (two) times daily. (Patient not taking: Reported on 04/02/2015) 60 each 2     Previous Psychotropic Medications: celexa/lamictal for years- feels these help. More recently - a few  weeks- has been prescribed xanax   Substance Abuse History in the last 12 months:   Denies any history of alcohol abuse , occasional cannabis abuse , states she used Public relations account executive ( MDMA) , cocaine last week but it was isolated episode . 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     Consequences of Substance Abuse: denies   Results for orders placed or performed during the hospital encounter of 04/02/15 (from the past 72 hour(s))  TSH     Status: None   Collection Time: 04/02/15 10:51 PM  Result Value Ref Range   TSH 2.847 0.350 - 4.500 uIU/mL  T4, free     Status: Abnormal   Collection Time: 04/02/15 10:51 PM  Result Value Ref Range   Free T4 1.19 (H) 0.61 - 1.12 ng/dL    Comment: Performed at [facility]  Comprehensive metabolic panel     Status: Abnormal   Collection Time: 04/02/15 10:56 PM  Result Value Ref Range   Sodium 136 135 - 145 mmol/L   Potassium 3.4 (L) 3.5 - 5.1 mmol/L   Chloride 103 101 - 111 mmol/L   CO2 27 22 - 32 mmol/L   Glucose, Bld 108 (H) 65 - 99 mg/dL   BUN 8 6 - 20 mg/dL   Creatinine, Ser 0.93 0.44 - 1.00 mg/dL   Calcium 8.9 8.9 - 10.3 mg/dL   Total Protein 7.7 6.5 - 8.1 g/dL   Albumin 4.4 3.5 - 5.0 g/dL   AST 14 (L) 15 - 41 U/L   ALT 12 (L) 14 - 54 U/L   Alkaline Phosphatase 87 38 - 126 U/L   Total Bilirubin 0.5 0.3 - 1.2 mg/dL   GFR calc non Af Amer >60 >60 mL/min   GFR calc Af Amer >60 >60 mL/min    Comment: (NOTE) The eGFR has been calculated using the CKD EPI equation. This calculation has not been validated in all clinical situations. eGFR's persistently <60 mL/min signify possible Chronic Kidney Disease.    Anion gap 6 5 - 15  Ethanol (ETOH)     Status: None   Collection Time: 04/02/15 10:56 PM  Result Value Ref Range   Alcohol, Ethyl (B) <5 <5 mg/dL    Comment:        LOWEST DETECTABLE LIMIT FOR SERUM ALCOHOL IS 5 mg/dL FOR MEDICAL  PURPOSES ONLY   Salicylate level     Status: None   Collection Time: 04/02/15 10:56 PM  Result Value Ref Range   Salicylate Lvl <0.8 2.8 - 30.0 mg/dL  Acetaminophen level     Status: Abnormal   Collection Time: 04/02/15 10:56 PM  Result Value Ref Range   Acetaminophen (Tylenol), Serum <10 (L) 10 - 30 ug/mL    Comment:        THERAPEUTIC CONCENTRATIONS VARY SIGNIFICANTLY. A RANGE OF 10-30 ug/mL MAY BE AN EFFECTIVE CONCENTRATION FOR MANY PATIENTS. HOWEVER, SOME ARE BEST TREATED AT CONCENTRATIONS OUTSIDE THIS RANGE. ACETAMINOPHEN CONCENTRATIONS >150 ug/mL AT 4 HOURS AFTER INGESTION AND >50 ug/mL AT 12 HOURS AFTER INGESTION ARE OFTEN ASSOCIATED WITH TOXIC REACTIONS.   CBC     Status: None   Collection Time: 04/02/15 10:56 PM  Result Value Ref Range   WBC 7.8 4.0 - 10.5 K/uL   RBC 4.82 3.87 - 5.11 MIL/uL   Hemoglobin 13.8 12.0 - 15.0 g/dL   HCT 42.1 36.0 - 46.0 %   MCV 87.3 78.0 - 100.0 fL   MCH 28.6 26.0 - 34.0 pg   MCHC 32.8 30.0 - 36.0 g/dL   RDW 12.9 11.5 - 15.5 %   Platelets 277 150 - 400 K/uL  Urine rapid drug screen (hosp performed) (Not at [facility])     Status: Abnormal   Collection Time: 04/03/15 12:08 AM  Result Value Ref Range   Opiates NONE DETECTED NONE DETECTED   Cocaine NONE DETECTED NONE DETECTED   Benzodiazepines POSITIVE (A) NONE DETECTED   Amphetamines NONE DETECTED NONE DETECTED  Tetrahydrocannabinol NONE DETECTED NONE DETECTED   Barbiturates NONE DETECTED NONE DETECTED    Comment:        DRUG SCREEN FOR MEDICAL PURPOSES ONLY.  IF CONFIRMATION IS NEEDED FOR ANY PURPOSE, NOTIFY LAB WITHIN 5 DAYS.        LOWEST DETECTABLE LIMITS FOR URINE DRUG SCREEN Drug Class       Cutoff (ng/mL) Amphetamine      1000 Barbiturate      200 Benzodiazepine   308 Tricyclics       657 Opiates          300 Cocaine          300 THC              50   Pregnancy, urine     Status: None   Collection Time: 04/03/15 12:08 AM  Result Value Ref Range   Preg Test, Ur  NEGATIVE NEGATIVE    Comment:        THE SENSITIVITY OF THIS METHODOLOGY IS >20 mIU/mL.     Observation Level/Precautions:  15 minute checks  Laboratory:  as needed   Psychotherapy:  Milieu. Groups .   Medications:  For now continue Celexa 40 mgrs QDAY , for now will  Not restart lamictal - states it is helpful and well tolerated but as noted recently overdosed on it . Alprazolam PRNs  For anxiety. Of note denies any BZD abuse .   Consultations:  As needed   Discharge Concerns:  -   Estimated LOS: 4-5 days   Other:     Psychological Evaluations:  No   Treatment Plan Summary: Daily contact with patient to assess and evaluate symptoms and progress in treatment, Medication management, Plan inpatient admission and meds as above   Medical Decision Making:  Established Problem, Stable/Improving (1), Review of Psycho-Social Stressors (1), Review or order clinical lab tests (1) and Review of Medication Regimen & Side Effects (2)  I certify that inpatient services furnished can reasonably be expected to improve the patient's condition.   Shyleigh Daughtry, Felicita Gage 9/8/20169:54 AM

## 2015-04-04 NOTE — [facility] Suicide Risk Assessment (Signed)
[facility] Admission Suicide Risk Assessment   Nursing information obtained from:   chart/patient Demographic factors:   24 year old single female, lives with son, employed  Current Mental Status:   see below  Loss Factors:   psychosocial stressors, family tension Historical Factors:   history of substance abuse , history of  depression Risk Reduction Factors:   sense of responsibility to son/ family Total Time spent with patient: 45 minutes Principal Problem: Suicide Attempt  Diagnosis:   Patient Active Problem List   Diagnosis Date Noted  . Suicide attempt [T14.91] 04/03/2015  . Bipolar 1 disorder, mixed, moderate [F31.62] 12/13/2013     Continued Clinical Symptoms:  Alcohol Use Disorder Identification Test Final Score (AUDIT): 0 The "Alcohol Use Disorders Identification Test", Guidelines for Use in Primary Care, Second Edition.  World Science writer [facility]). Score between 0-7:  no or low risk or alcohol related problems. Score between 8-15:  moderate risk of alcohol related problems. Score between 16-19:  high risk of alcohol related problems. Score 20 or above:  warrants further diagnostic evaluation for alcohol dependence and treatment.   CLINICAL FACTORS:  24 year old female, fair historian, states she impulsively overdosed on lamictal, which is prescribed to her for mood disorder. States overdose was impulsive and related to tension with father, who blamed her for her sister's drug use. At this time tends to minimize seriousness of overdose, and states she was simply trying to " relax and feel better " from taking more medications. Does endorse history of depression in the past, at this time does not endorse history of mania or hypomania, but has been diagnosed with Bipolar Disorder in the past . Dx- Depression NOS- Suicide attempt      Psychiatric Specialty Exam: Physical Exam  ROS  Blood pressure 110/64, pulse 73, temperature 98.1 F (36.7 C), temperature source Oral,  resp. rate 18, height  (1.651 m), weight 154 lb (69.854 kg), last menstrual period 03/31/2015, SpO2 100 %.Body mass index is 25.63 kg/(m^2).  See Admit Note MSE   COGNITIVE FEATURES THAT CONTRIBUTE TO RISK:  Closed-mindedness and Loss of executive function    SUICIDE RISK:   Moderate:  Frequent suicidal ideation with limited intensity, and duration, some specificity in terms of plans, no associated intent, good self-control, limited dysphoria/symptomatology, some risk factors present, and identifiable protective factors, including available and accessible social support.  PLAN OF CARE: Patient will be admitted to inpatient psychiatric unit for stabilization and safety. Will provide and encourage milieu participation. Provide medication management and maked adjustments as needed.  Will follow daily.    Medical Decision Making:  Established Problem, Stable/Improving (1), Review of Psycho-Social Stressors (1), Review or order clinical lab tests (1) and Review of Medication Regimen & Side Effects (2)  I certify that inpatient services furnished can reasonably be expected to improve the patient's condition.   Moises Terpstra 04/04/2015, 12:51 PM

## 2015-04-04 NOTE — Progress Notes (Signed)
Adult Psychoeducational Group Note  Date:  04/04/2015 Time:  10:14 PM  Group Topic/Focus:  Wrap-Up Group:   The focus of this group is to help patients review their daily goal of treatment and discuss progress on daily workbooks.  Participation Level:  Active  Participation Quality:  Appropriate and Attentive  Affect:  Appropriate  Cognitive:  Appropriate  Insight: Good  Engagement in Group:  Engaged  Modes of Intervention:  Education  Additional Comments:  Patient goal for today was to find coping skills to help work on anger issues. Patient mom and boyfriend visited which made her upset. Patient mentioned mom have a lot of negative things to say.   Merlinda Frederick 04/04/2015, 10:14 PM

## 2015-04-05 MED ORDER — ALPRAZOLAM 1 MG PO TABS
1.0000 mg | ORAL_TABLET | Freq: Two times a day (BID) | ORAL | Status: DC | PRN
  Administered 2015-04-05 – 2015-04-06 (×2): 1 mg via ORAL
  Filled 2015-04-05 (×2): qty 1

## 2015-04-05 MED ORDER — DIPHENHYDRAMINE HCL 25 MG PO CAPS: 25.0000 mg | ORAL_CAPSULE | Freq: Three times a day (TID) | ORAL | Status: DC | PRN
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
## 2015-04-05 NOTE — BHH Group Notes (Signed)
   [facility] LCSW Aftercare Discharge Planning Group Note  04/05/2015  8:45 AM   Participation Quality: Alert, Appropriate and Oriented  Mood/Affect: Appropriate  Depression Rating: 3  Anxiety Rating: 6  Thoughts of Suicide: Pt denies SI/HI  Will you contract for safety? Yes  Current AVH: Pt denies  Plan for Discharge/Comments: Pt attended discharge planning group and actively participated in group. CSW provided pt with today's workbook. Patient plans to return home to follow up with outpatient services at [facility].  Transportation Means: Pt reports access to transportation  Supports: No supports mentioned at this time  [name], MSW, [employer] Clinical Social Worker [employer] [phone]

## 2015-04-05 NOTE — Progress Notes (Signed)
DAR Note:  Patient mood and affect is pleasant.  Rates hopelessness and anxiety at 2 out of 10.  Rates depression at 3 out of 10.  Denies auditory and visual hallucinations.  Reports goal for today is to work on what to do after discharge.  Compliant with medication and treatment plan.  Maintained on routine safety checks per protocol.  Support and encouragement offered as needed.  Observed socializing with peers on the unit.

## 2015-04-05 NOTE — Progress Notes (Signed)
  [facility] Adult Case Management Discharge Plan :  Will you be returning to the same living situation after discharge:  Yes,  Patient plans to return home At discharge, do you have transportation home?: Yes, patient reports access to transportation Do you have the ability to pay for your medications: Yes, patient will be provided with prescriptions at discharge  Release of information consent forms completed and in the chart;  Patient's signature needed at discharge.  Patient to Follow up at: Follow-up Information    Follow up with [facility] .   Why:  Medication management appt with Dr. Tenny Craw on Thursday Sept 15th with Dr. Tenny Craw. Therapy appt on Tuesday Sept. 27th with Dr. Kieth Brightly. Please call office if you need to reschedule appointment.   Contact information:   [address], [address] [place], [place] [address] Phone: [phone]      Patient denies SI/HI: Yes,  denies    Aeronautical engineer and Suicide Prevention discussed: Yes,  with patient  Have you used any form of tobacco in the last 30 days? (Cigarettes, Smokeless Tobacco, Cigars, and/or Pipes): Yes  Has patient been referred to the Quitline?: Patient refused referral  Lacrystal Barbe, [place] 04/05/2015, 7:06 PM

## 2015-04-05 NOTE — [facility] Group Notes (Signed)
[facility] LCSW Group Therapy 04/05/2015 1:15 PM Type of Therapy: Group Therapy Participation Level: Active  Participation Quality: Attentive, Sharing and Supportive  Affect: Appropriate  Cognitive: Alert and Oriented  Insight: Developing/Improving and Engaged  Engagement in Therapy: Developing/Improving and Engaged  Modes of Intervention: Clarification, Confrontation, Discussion, Education, Exploration, Limit-setting, Orientation, Problem-solving, Rapport Building, Dance movement psychotherapist, Socialization and Support  Summary of Progress/Problems: The topic for today was feelings about relapse. Pt discussed what relapse prevention is to them and identified triggers that they are on the path to relapse. Pt processed their feeling towards relapse and was able to relate to peers. Pt discussed coping skills that can be used for relapse prevention. Patient expressed wanting to set boundaries with her father as he is a negative influence in her life. CSW and other group members provided patient with emotional support and encouragement.   [name], MSW, [employer] Clinical Social Worker [facility] [phone]

## 2015-04-05 NOTE — BHH Suicide Risk Assessment (Signed)
[facility] INPATIENT:  Family/Significant Other Suicide Prevention Education  Suicide Prevention Education:   Patient Refusal for Family/Significant Other Suicide Prevention Education: The patient Monica Patel has refused to provide written consent for family/significant other to be provided Family/Significant Other Suicide Prevention Education during admission and/or prior to discharge.  Physician notified. SPE reviewed with patient and brochure provided. Patient encouraged to return to hospital if having suicidal thoughts, patient verbalized his/her understanding and has no further questions at this time.   Zaeem Kandel, [place] 04/05/2015, 1:09 PM

## 2015-04-05 NOTE — Progress Notes (Signed)
D: Pt at this time is alert and oriented x 4. Pt complained of mild depression of 3 on a 0-10 depression scale. She states, "I have gotten way better than what I was when I came in; I could be going home this Saturday." Pt at this time denies SI/HI/AVH. Pt also denies pain and anxiety. Pt was observed having appropriate conversation with peers. Pt continues to be pleasant and cooperative through her assessment.  A: Medications administered as prescribed.  Support, encouragement, and safe environment provided.  15-minute safety checks continue.  R: Pt attended group. Pt was med compliant.  Pt attended wrap-up group. Safety checks continue.

## 2015-04-05 NOTE — Progress Notes (Signed)
D: Patient seen on dayroom watching TV and interacting with peers. Pleasant and cooperative. Denies pain, SI, AH/VH at this time. Patient made no new complaint.  A: patient encouraged to continue with the treatment plan and verbalize needs to staff. Due medications given as ordered. Every 15 minutes check for safety maintained. Will continue to monitor patient for safety and stability. R: Patient remains safe.

## 2015-04-05 NOTE — Progress Notes (Signed)
Adult Psychoeducational Group Note  Date:  04/05/2015 Time:  9:54 PM  Group Topic/Focus:  Wrap-Up Group:   The focus of this group is to help patients review their daily goal of treatment and discuss progress on daily workbooks.  Participation Level:  Active  Participation Quality:  Appropriate and Attentive  Affect:  Appropriate  Cognitive:  Appropriate  Insight: Appropriate and Good  Engagement in Group:  Engaged  Modes of Intervention:  Education  Additional Comments:  Patient relapse prevention goal is to cut negative people out her life.   Monica Patel 04/05/2015, 9:54 PM

## 2015-04-05 NOTE — Progress Notes (Signed)
[facility] MD Progress Note  04/05/2015 6:11 PM Monica Patel  MRN:  [id number] Subjective:  Patient states she is doing okay. At this time she is not endorsing significant depression and denying any suicidal ideations. She states "I just want to leave soon so I can see my son."  Of note, patient's mother contacted nursing staff expressing concern that patient was trying to minimize symptoms. Patient denies this and states that she is genuinely feeling better.   Objective:  I have discussed case with treatment team and met with patient.  She presents fully alert, attentive, pleasant, and euthymic. Smiles and even laughs appropriately during session. She is not currently endorsing any major neurovegetative symptoms. She is denying medication side effects.  She does continue to ruminate about family stressors and affect does become more constricted when discussing this issue. She perceives her parents blame and accuse her for her sister's behaviors that she herself cannot control. Patient states that she is trying to maintain distance from family to minimize stress. I offered to contact mother in order to set up family meeting/discuss family issues but patient declined to provide consent at this time.  No disruptive or agitated behaviors on unit.   Principal Problem: Suicide attempt Diagnosis:   Patient Active Problem List   Diagnosis Date Noted  . Suicide attempt [T14.91] 04/03/2015  . Bipolar 1 disorder, mixed, moderate [F31.62] 12/13/2013   Total Time spent with patient: 25 minutes   Past Medical History:  Past Medical History  Diagnosis Date  . Anemia   . Infection     UTI  . Abnormal Pap smear     colpo  . Depression   . Mania   . Thyroid disease   . 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 Genital herpes   . Anxiety   . Bipolar 1 disorder     Past Surgical History  Procedure Laterality Date  . No past surgeries    . Dilation and evacuation  06/04/2012    Procedure: DILATATION AND EVACUATION;   Surgeon: Melina Schools, MD;  Location: [facility];  Service: Gynecology;  Laterality: N/A;   Family History:  Family History  Problem Relation Age of Onset  . Other Neg Hx   . Anxiety disorder Father   . Depression Father   . Anxiety disorder Sister   . Depression Sister   . Anxiety disorder Sister   . OCD Sister   . Depression Mother    Social History:  History  Alcohol Use No     History  Drug Use No    Social History   Social History  . Marital Status: Single    Spouse Name: N/A  . Number of Children: N/A  . Years of Education: N/A   Social History Main Topics  . Smoking status: Current Every Day Smoker -- 1.00 packs/day for 6 years    Types: Cigarettes  . Smokeless tobacco: Never Used  . Alcohol Use: No  . Drug Use: No  . Sexual Activity: Yes    Birth Control/ Protection: Pill   Other Topics Concern  . None   Social History Narrative   Additional History:    Sleep: Good  Appetite:  Good   Assessment:   Musculoskeletal: Strength & Muscle Tone: within normal limits Gait & Station: normal Patient leans: N/A   Psychiatric Specialty Exam: Physical Exam  ROS Does not report nausea, vomiting, or rash  Blood pressure 102/66, pulse 87, temperature 97.8 F (36.6 C), temperature source  Oral, resp. rate 18, height 5' 5"  (1.651 m), weight 154 lb (69.854 kg), last menstrual period 03/31/2015, SpO2 100 %.Body mass index is 25.63 kg/(m^2).  General Appearance: Fairly Groomed  Engineer, water::  Good  Speech:  Normal Rate  Volume:  Normal  Mood:  Minimizes depression, states that she feels better.  Affect:  Appropriate and reactive for the most part, but constricted and irritable when discussing family stress.  Thought Process:  Linear  Orientation:  Full (Time, Place, and Person)  Thought Content:  No hallucinations, no delusions, positive ruminations about family stress  Suicidal Thoughts:  No  Homicidal Thoughts:  No  Memory:  Grossly in tact  Judgement:   Fair  Insight:  Fair  Psychomotor Activity:  Normal  Concentration:  Good  Recall:  Good  Fund of Knowledge:Good  Language: Good  Akathisia:  Negative  Handed:  Right  AIMS (if indicated):     Assets:  Physical Health Resilience  ADL's:  Improving  Cognition: WNL  Sleep:  Number of Hours: 6.5     Current Medications: Current Facility-Administered Medications  Medication Dose Route Frequency Provider Last Rate Last Dose  . ALPRAZolam Duanne Moron) tablet 1 mg  1 mg Oral TID PRN Shuvon B Rankin, NP   1 mg at 04/04/15 2113  . alum & mag hydroxide-simeth (MAALOX/MYLANTA) 200-200-20 MG/5ML suspension 30 mL  30 mL Oral Q4H PRN Shuvon B Rankin, NP      . citalopram (CELEXA) tablet 40 mg  40 mg Oral Daily Jenne Campus, MD   40 mg at 04/05/15 0823  . levothyroxine (SYNTHROID, LEVOTHROID) tablet 75 mcg  75 mcg Oral QAC breakfast Shuvon B Rankin, NP   75 mcg at 04/05/15 0625  . magnesium hydroxide (MILK OF MAGNESIA) suspension 30 mL  30 mL Oral Daily PRN Shuvon B Rankin, NP      . nicotine polacrilex (NICORETTE) gum 2 mg  2 mg Oral PRN Jenne Campus, MD   2 mg at 04/05/15 1607    Lab Results: No results found for this or any previous visit (from the past 39 hour(s)).  Physical Findings: AIMS: Facial and Oral Movements Muscles of Facial Expression: None, normal Lips and Perioral Area: None, normal Jaw: None, normal Tongue: None, normal,Extremity Movements Upper (arms, wrists, hands, fingers): None, normal Lower (legs, knees, ankles, toes): None, normal, Trunk Movements Neck, shoulders, hips: None, normal, Overall Severity Severity of abnormal movements (highest score from questions above): None, normal Incapacitation due to abnormal movements: None, normal Patient's awareness of abnormal movements (rate only patient's report): No Awareness, Dental Status Current problems with teeth and/or dentures?: No Does patient usually wear dentures?: No  CIWA:    COWS:     Assessment: Today  patient reports significant improvement and continues to state that he overdose was not suicidal in intent. She is not endorsing symptoms of severe depression, and presents with a reactive affect. Family stress/tension are an ongoing stressor for patient. As per nursing, mother expressed concern that patient is minimizing symptoms. Patient has not given consent to speak with mother at present time. Tolerated medications well.  Treatment Plan Summary: Daily contact with patient to assess and evaluate symptoms and progress in treatment, Medication management and Plan Ongoing inpatient treatment and medications as below. Continue Celexa 40 mg qday for depression and anxiety Continue Synthroid 75 mcg qday for hypothyroidism Continue Nicorette gum for cigarette cravings Taper down Xanax to 1 mg BID prn severe anxiety Continue to provide milieu and support  Medical Decision Making:  Established Problem, Stable/Improving (1), Review of Psycho-Social Stressors (1), Review or order clinical lab tests (1) and Review of Medication Regimen & Side Effects (2)     Monica Patel 04/05/2015, 6:11 PM

## 2015-04-06 DIAGNOSIS — T50992A Poisoning by other drugs, medicaments and biological substances, intentional self-harm, initial encounter: Secondary | ICD-10-CM

## 2015-04-06 NOTE — BHH Group Notes (Signed)
[facility] Group Notes:  (Clinical Social Work)  04/06/2015     1:15-2:15PM  Summary of Progress/Problems:   The main focus of today's process group was to contemplate healthy coping skills and how to learn more, not just in this setting, but after leaving the hospital.  Patients listed needs on the whiteboard and unhealthy coping techniques often used to fill needs.  Motivational Interviewing and the whiteboard were utilized to help patients explore in depth the perceived benefits and costs of unhealthy coping techniques, as well as the  benefits and costs of replacing that with a healthy coping skills.    Type of Therapy:  Group Therapy - Process   Participation Level:  Active  Participation Quality:  Appropriate, Attentive, Sharing and Supportive  Affect:  Blunted and Tearful  Cognitive:  Alert, Appropriate and Oriented  Insight:  Engaged  Engagement in Therapy:  Engaged  Modes of Intervention:  Education, Motivational Interviewing  Ambrose Mantle, LCSW 04/06/2015, 4:37 PM

## 2015-04-06 NOTE — Progress Notes (Signed)
Adult Psychoeducational Group Note  Date:  04/06/2015 Time:  10:56 PM  Group Topic/Focus:  Wrap-Up Group:   The focus of this group is to help patients review their daily goal of treatment and discuss progress on daily workbooks.  Participation Level:  Active  Participation Quality:  Appropriate and Supportive  Affect:  Appropriate  Cognitive:  Alert and Appropriate  Insight: Good and Improving  Engagement in Group:  Engaged and Supportive  Modes of Intervention:  Discussion and Education  Additional Comments:  Patient reported "cutting off" communication with her parents.  Patient, however, reported working to strengthen her relationship with her sister.    Elmore Guise N 04/06/2015, 10:56 PM

## 2015-04-06 NOTE — Plan of Care (Signed)
Problem: Consults Goal: Suicide Risk Patient Education (See Patient Education module for education specifics)  Outcome: Progressing Pt has contracted for safety and has agreed to come to staff if feelings return.

## 2015-04-06 NOTE — Progress Notes (Signed)
D-  Patients presents with blunted affect, brightens on approach. Goal for today is to continue to be positive  A- Support and Encouragement provided, Allowed patient to ventilate during 1:1.Pt reports looking forward to going home and taking care of son. Dosen't know how to improve relationship   R- Will continue to monitor on q 15 minute checks for safety, compliant with medications and programming

## 2015-04-06 NOTE — Progress Notes (Signed)
Adult Psychoeducational Group Note  Date:  04/06/2015 Time: 10:00 am  Group Topic/Focus:  Goals Group:   The focus of this group is to help patients establish daily goals to achieve during treatment and discuss how the patient can incorporate goal setting into their daily lives to aide in recovery.  Participation Level:  Active  Participation Quality:  Appropriate and Attentive  Affect:  Appropriate  Cognitive:  Alert, Appropriate and Oriented  Insight: Appropriate  Engagement in Group:  Developing/Improving  Modes of Intervention:  Discussion and Education  Additional Comments:  Pt identified being a good mom was her positive quality  Jimmey Ralph 04/06/2015, 12:09 PM
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
## 2015-04-07 DIAGNOSIS — F3160 Bipolar disorder, current episode mixed, unspecified: Principal | ICD-10-CM

## 2015-04-07 MED ORDER — NICOTINE POLACRILEX 2 MG MT GUM: 2.0000 mg | CHEWING_GUM | OROMUCOSAL | Status: DC | PRN

## 2015-04-07 MED ORDER — ALPRAZOLAM 1 MG PO TABS: 1.0000 mg | ORAL_TABLET | Freq: Two times a day (BID) | ORAL | Status: DC | PRN

## 2015-04-07 MED ORDER — CITALOPRAM HYDROBROMIDE 40 MG PO TABS: 40.0000 mg | ORAL_TABLET | Freq: Every day | ORAL | Status: DC

## 2015-04-07 MED ORDER — LEVOTHYROXINE SODIUM 75 MCG PO TABS: 75.0000 ug | ORAL_TABLET | Freq: Every day | ORAL | Status: DC

## 2015-04-07 NOTE — [facility] Suicide Risk Assessment (Signed)
[facility] Discharge Suicide Risk Assessment   Demographic Factors:  Adolescent or young adult, Caucasian and Unemployed  Total Time spent with patient: 30 minutes  Musculoskeletal: Strength & Muscle Tone: within normal limits Gait & Station: normal Patient leans: N/A  Psychiatric Specialty Exam: Physical Exam  ROS  Blood pressure 109/66, pulse 80, temperature 98.2 F (36.8 C), temperature source Oral, resp. rate 18, height 5\' 5"  (1.651 m), weight 69.854 kg (154 lb), last menstrual period 03/31/2015, SpO2 100 %.Body mass index is 25.63 kg/(m^2).  General Appearance: Casual  Eye Contact::  Good  Speech:  Clear and Coherent409  Volume:  Normal  Mood:  Euthymic  Affect:  Appropriate and Congruent  Thought Process:  Coherent, Goal Directed and Intact  Orientation:  Full (Time, Place, and Person)  Thought Content:  WDL  Suicidal Thoughts:  No  Homicidal Thoughts:  No  Memory:  Immediate;   Good Recent;   Good Remote;   Good  Judgement:  Intact  Insight:  Good  Psychomotor Activity:  Normal  Concentration:  Good  Recall:  Good  Fund of Knowledge:Good  Language: Good  Akathisia:  Negative  Handed:  Right  AIMS (if indicated):     Assets:  Communication Skills Desire for Improvement Financial Resources/Insurance Housing Intimacy Leisure Time Physical Health Resilience Social Support Talents/Skills Transportation  Sleep:  Number of Hours: 6.7  Cognition: WNL  ADL's:  Intact   Have you used any form of tobacco in the last 30 days? (Cigarettes, Smokeless Tobacco, Cigars, and/or Pipes): Yes  Has this patient used any form of tobacco in the last 30 days? (Cigarettes, Smokeless Tobacco, Cigars, and/or Pipes) Yes, A prescription for an FDA-approved tobacco cessation medication was offered at discharge and the patient refused  Mental Status Per Nursing Assessment::   On Admission:     Current Mental Status by Physician: NA  Loss Factors: NA  Historical Factors: Family  history of mental illness or substance abuse and Impulsivity  Risk Reduction Factors:   Responsible for children under 64 years of age, Sense of responsibility to family, Religious beliefs about death, Living with another person, especially a relative, Positive social support and Positive coping skills or problem solving skills  Continued Clinical Symptoms:  Bipolar Disorder:   Depressive phase Depression:   Recent sense of peace/wellbeing Previous Psychiatric Diagnoses and Treatments Medical Diagnoses and Treatments/Surgeries  Cognitive Features That Contribute To Risk:  None    Suicide Risk:  Minimal: No identifiable suicidal ideation.  Patients presenting with no risk factors but with morbid ruminations; may be classified as minimal risk based on the severity of the depressive symptoms  Principal Problem: Suicide attempt Discharge Diagnoses:  Patient Active Problem List   Diagnosis Date Noted  . Suicide attempt [T14.91] 04/03/2015  . Bipolar 1 disorder, mixed, moderate [F31.62] 12/13/2013    Follow-up Information    Follow up with [facility]  .   Why:  Medication management appt with Dr. Tenny Craw on Thursday Sept 15th with Dr. Tenny Craw. Therapy appt on Tuesday Sept. 27th with Dr. Kieth Brightly. Please call office if you need to reschedule appointment.   Contact information:   [address], [address] [place], [place] [address] Phone: [phone]      Plan Of Care/Follow-up recommendations:  Activity:  As tolerated Diet:  Regular  Is patient on multiple antipsychotic therapies at discharge:  No   Has Patient had three or more failed trials of antipsychotic monotherapy by history:  No  Recommended Plan for Multiple  Antipsychotic Therapies: NA    Monica Patel,Monica Patel. 04/07/2015, 12:24 PM

## 2015-04-07 NOTE — Plan of Care (Signed)
Problem: Ineffective individual coping Goal: LTG: Patient will report a decrease in negative feelings Outcome: Progressing Pt reports feeling much better. Ready to see child Goal: STG: Patient will remain free from self harm Outcome: Progressing No self harm reported or observed

## 2015-04-07 NOTE — Progress Notes (Signed)
D: Patient pleasant and interactive on dayroom. Denies pain, SI, AH/VH. Compliant with the treatment plan. Made no new complaint. A: Patient encouraged to continue with the treatment plan and verbalize needs to staff. Due medications given as ordered. Every 15 minutes check for safety maintained. Will continue to monitor patient for safety and stability. R: Patient remains safe.

## 2015-04-07 NOTE — Discharge Summary (Signed)
Physician Discharge Summary Note  Patient:  Monica Patel is an 24 y.o., female MRN:  [id number] DOB:  09-14-1990 Patient phone:  [phone] (home)  Patient address:   [address] [place] [address],  Total Time spent with patient: 30 minutes  Date of Admission:  04/03/2015 Date of Discharge: 04/07/2015  Reason for Admission:  Mood stabilization treatments  Principal Problem: Suicide attempt Discharge Diagnoses: Patient Active Problem List   Diagnosis Date Noted  . Bipolar I disorder, most recent episode mixed [F31.60]   . Suicide attempt [T14.91] 04/03/2015  . Bipolar 1 disorder, mixed, moderate [F31.62] 12/13/2013    Musculoskeletal: Strength & Muscle Tone: within normal limits Gait & Station: normal Patient leans: N/A  Psychiatric Specialty Exam: Physical Exam  Psychiatric: She has a normal mood and affect. Her speech is normal and behavior is normal. Judgment and thought content normal. Cognition and memory are normal.    Review of Systems  Constitutional: Negative.   HENT: Negative.   Eyes: Negative.   Respiratory: Negative.   Cardiovascular: Negative.   Gastrointestinal: Negative.   Genitourinary: Negative.   Musculoskeletal: Negative.   Skin: Negative.   Neurological: Negative.   Endo/Heme/Allergies: Negative.   Psychiatric/Behavioral: Negative for depression, suicidal ideas, hallucinations, memory loss and substance abuse. The patient is not nervous/anxious and does not have insomnia.     Blood pressure 109/66, pulse 80, temperature 98.2 F (36.8 C), temperature source Oral, resp. rate 18, height 5\' 5"  (1.651 m), weight 69.854 kg (154 lb), last menstrual period 03/31/2015, SpO2 100 %.Body mass index is 25.63 kg/(m^2).  See Physician SRA     Have you used any form of tobacco in the last 30 days? (Cigarettes, Smokeless Tobacco, Cigars, and/or Pipes): Yes  Has this patient used any form of tobacco in the last 30 days? (Cigarettes, Smokeless Tobacco, Cigars,  and/or Pipes) Yes, A prescription for an FDA-approved tobacco cessation medication was offered at discharge and the patient refused  Past Medical History:  Past Medical History  Diagnosis Date  . Anemia   . Infection     UTI  . Abnormal Pap smear     colpo  . Depression   . Mania   . Thyroid disease   . 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 Genital herpes   . Anxiety   . Bipolar 1 disorder     Past Surgical History  Procedure Laterality Date  . No past surgeries    . Dilation and evacuation  06/04/2012    Procedure: DILATATION AND EVACUATION;  Surgeon: Bing Plume, MD;  Location: [facility];  Service: Gynecology;  Laterality: N/A;   Family History:  Family History  Problem Relation Age of Onset  . Other Neg Hx   . Anxiety disorder Father   . Depression Father   . Anxiety disorder Sister   . Depression Sister   . Anxiety disorder Sister   . OCD Sister   . Depression Mother    Social History:  History  Alcohol Use No     History  Drug Use No    Social History   Social History  . Marital Status: Single    Spouse Name: N/A  . Number of Children: N/A  . Years of Education: N/A   Social History Main Topics  . Smoking status: Current Every Day Smoker -- 1.00 packs/day for 6 years    Types: Cigarettes  . Smokeless tobacco: Never Used  . Alcohol Use: No  . Drug Use: No  .  Sexual Activity: Yes    Birth Control/ Protection: Pill   Other Topics Concern  . None   Social History Narrative    Risk to Self: Is patient at risk for suicide?: Yes What has been your use of drugs/alcohol within the last 12 months?: No current substance abuse  Risk to Others:   Prior Inpatient Therapy:   Prior Outpatient Therapy:    Level of Care:  OP  Hospital Course:    Monica Patel is a 24 year old female who recently impulsively overdosed on about 15 tablets of lamictal, which is prescribed to her. States her BF found out she had overdosed and brought her to the hospital. She states overdose  was " not to hurt myself, but just to feel better ".  She denies any significant depression prior to her overdose and and in fact feels she was doing well on her medications. Overdose was impulsive , and she states the purpose of it was not to hurt herself " I was just nervous because my dad was blaming me for my sister using heroin. He was trying to get me kicked out of my apartment ".         SHIAN GOODNOW was admitted to the adult 400 unit where she was evaluated and her symptoms were identified. Medication management was discussed and implemented. She was encouraged to participate in unit programming. Her Xanax was decreased to avoid potential issues with dependence. Her Celexa was increased to 40 mg daily for depression. Medical problems were identified and treated appropriately. Home medication was restarted as needed. Her Synthroid was continued for treatment of hypothyroidism.  She was evaluated each day by a clinical provider to ascertain the patient's response to treatment.  Improvement was noted by the patient's report of decreasing symptoms, improved sleep and appetite, affect, medication tolerance, behavior, and participation in unit programming.  The patient was asked each day to complete a self inventory noting mood, mental status, pain, new symptoms, anxiety and concerns.         She responded well to medication and being in a therapeutic and supportive environment. Positive and appropriate behavior was noted and the patient was motivated for recovery.  She worked closely with the treatment team and case manager to develop a discharge plan with appropriate goals. Coping skills, problem solving as well as relaxation therapies were also part of the unit programming. Patient discussed her problems with family and became upset when mother called to express concern. The patient reported that her mood was stable but the problem was being triggered emotionally by her family.          By the day of  discharge she was in much improved condition than upon admission.  Symptoms were reported as significantly decreased or resolved completely. The patient denied SI/HI and voiced no AVH. She was motivated to continue taking medication with a goal of continued improvement in mental health.   CHAUNTAY PASZKIEWICZ was discharged home with a plan to follow up as noted below. Patient left [facility] in no acute distress with prescriptions for her psychotropic medications.   Consults:  None  Significant Diagnostic Studies:  Chemistry panel, CBC, UDS  Discharge Vitals:   Blood pressure 109/66, pulse 80, temperature 98.2 F (36.8 C), temperature source Oral, resp. rate 18, height  (1.651 m), weight 69.854 kg (154 lb), last menstrual period 03/31/2015, SpO2 100 %. Body mass index is 25.63 kg/(m^2). Lab Results:   No results found for  this or any previous visit (from the past 72 hour(s)).  Physical Findings: AIMS: Facial and Oral Movements Muscles of Facial Expression: None, normal Lips and Perioral Area: None, normal Jaw: None, normal Tongue: None, normal,Extremity Movements Upper (arms, wrists, hands, fingers): None, normal Lower (legs, knees, ankles, toes): None, normal, Trunk Movements Neck, shoulders, hips: None, normal, Overall Severity Severity of abnormal movements (highest score from questions above): None, normal Incapacitation due to abnormal movements: None, normal Patient's awareness of abnormal movements (rate only patient's report): No Awareness, Dental Status Current problems with teeth and/or dentures?: No Does patient usually wear dentures?: No  CIWA:    COWS:      See Psychiatric Specialty Exam and Suicide Risk Assessment completed by Attending Physician prior to discharge.  Discharge destination:  Home  Is patient on multiple antipsychotic therapies at discharge:  No   Has Patient had three or more failed trials of antipsychotic monotherapy by history:  No  Recommended Plan for  Multiple Antipsychotic Therapies: NA     Medication List    STOP taking these medications        lamoTRIgine 100 MG tablet  Commonly known as:  LAMICTAL      TAKE these medications      Indication   ALPRAZolam 1 MG tablet  Commonly known as:  XANAX  Take 1 tablet (1 mg total) by mouth 2 (two) times daily as needed for anxiety.   Indication:  Feeling Anxious     citalopram 40 MG tablet  Commonly known as:  CELEXA  Take 1 tablet (40 mg total) by mouth daily.   Indication:  Depression     levothyroxine 75 MCG tablet  Commonly known as:  SYNTHROID, LEVOTHROID  Take 1 tablet (75 mcg total) by mouth daily before breakfast.   Indication:  Underactive Thyroid     nicotine polacrilex 2 MG gum  Commonly known as:  NICORETTE  Take 1 each (2 mg total) by mouth as needed for smoking cessation.   Indication:  Nicotine Addiction       Follow-up Information    Follow up with [facility]  .   Why:  Medication management appt with Dr. Tenny Craw on Thursday Sept 15th with Dr. Tenny Craw. Therapy appt on Tuesday Sept. 27th with Dr. Kieth Brightly. Please call office if you need to reschedule appointment.   Contact information:   [address] [place], [place] [address] Phone: [phone]      Follow-up recommendations:    Activity: As tolerated Diet: Regular  Comments:   Take all your medications as prescribed by your mental healthcare provider.  Report any adverse effects and or reactions from your medicines to your outpatient provider promptly.  Patient is instructed and cautioned to not engage in alcohol and or illegal drug use while on prescription medicines.  In the event of worsening symptoms, patient is instructed to call the crisis hotline, 911 and or go to the nearest ED for appropriate evaluation and treatment of symptoms.  Follow-up with your primary care provider for your other medical issues, concerns and or health care needs.   Total Discharge Time: Greater  than 30 minutes  Signed: Fransisca Kaufmann, NP-C  04/08/2015, 9:16 AM   Patient seen face to face for this evaluation, chart reviewed, case discussed with physician extender, completed suicide risk assessment and formulated safe disposition treatment plan. Reviewed the information documented by psychiatric nurse practitioner and agree with the treatment plan.Darrol Jump R. 04/08/2015 11:20 AM

## 2015-04-07 NOTE — [facility] Group Notes (Signed)
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
## 2015-04-07 NOTE — Progress Notes (Signed)
Pt discharged home. DC instructions provided and explained. Medications reviewed. Rx given. All questions answered. Belongings returned.  Denies SI, HI, AVH. Plan to go home and take care of son and start a new life. Pt stable at discharge.

## 2015-04-07 NOTE — Progress Notes (Signed)
D: Pt reports sleeping good last pm. No sleep medication requested or needed. Appetite good. Pt reports anxiety and depression at 2, but is looking forward to being discharged home to see her son and start life again. Pt noted in mileu and dayroom smiling, laughing and talking with peers. Able to voice 3 coping skills to deal with stressors after discharge. Denies having any physical pain. Pt goal for today is to think of ways to prevent this from happening again, and to be discharged home.  A: Encouragement and support offered as needed. Medications given as prescrbed. q 15 min safety checks continues. R: Pt receptive and remains safe on unit. Will continue to assess and monitor

## 2015-04-11 ENCOUNTER — Ambulatory Visit (HOSPITAL_COMMUNITY): Payer: Self-pay | Admitting: Psychiatry

## 2015-04-23 ENCOUNTER — Ambulatory Visit (HOSPITAL_COMMUNITY): Payer: Self-pay | Admitting: Psychology

## 2015-05-03 ENCOUNTER — Ambulatory Visit (HOSPITAL_COMMUNITY): Payer: Self-pay | Admitting: Psychiatry

## 2015-08-13 MED FILL — valACYclovir HCL 1 GM TABS: 1 | 30 days supply | Qty: 30 | Fill #2

## 2015-08-30 MED FILL — CITALOPRAM HBR 20 MG TABLET: 20 | 90 days supply | Qty: 180 | Fill #2

## 2015-09-16 MED FILL — LEVOTHYROXINE 88 MCG TABLET: 88 | 90 days supply | Qty: 90 | Fill #2

## 2015-09-16 MED FILL — valACYclovir HCL 1 GM TABS: 1 | 30 days supply | Qty: 30 | Fill #3

## 2015-09-24 MED FILL — lamoTRIgine 100 MG TABS: 100 | 90 days supply | Qty: 180 | Fill #0

## 2015-09-30 ENCOUNTER — Emergency Department (HOSPITAL_COMMUNITY)
Admission: EM | Admit: 2015-09-30 | Discharge: 2015-09-30 | Disposition: A | Discharge status: Home / Self Care | Payer: 59 | Source: Home / Self Care | Attending: Family Medicine | Admitting: Family Medicine

## 2015-09-30 ENCOUNTER — Encounter (HOSPITAL_COMMUNITY): Payer: Self-pay | Admitting: *Deleted

## 2015-09-30 DIAGNOSIS — F410 Panic disorder [episodic paroxysmal anxiety] without agoraphobia: Secondary | ICD-10-CM

## 2015-09-30 DIAGNOSIS — F430 Acute stress reaction: Principal | ICD-10-CM

## 2015-09-30 NOTE — ED Provider Notes (Signed)
CSN: [id number]     Arrival date & time 09/30/15  1309 History   First MD Initiated Contact with Patient 09/30/15 1403     Chief Complaint  Patient presents with  . Chest Pain   (Consider location/radiation/quality/duration/timing/severity/associated sxs/prior Treatment) Patient is a 25 y.o. female presenting with chest pain. The history is provided by the patient.  Chest Pain Pain location:  Substernal area Pain quality: burning   Pain radiates to:  Does not radiate Pain radiates to the back: no   Pain severity:  Mild Onset quality:  Gradual Progression:  Worsening Chronicity:  New Context comment:  Stress, xs caffeine use.h/o panic attacks. Relieved by:  None tried Worsened by:  Nothing tried Associated symptoms: no palpitations     Past Medical History  Diagnosis Date  . Anemia   . Infection     UTI  . Abnormal Pap smear     colpo  . Depression   . Mania (HCC)   . Thyroid disease   . 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 Genital herpes   . Anxiety   . Bipolar 1 disorder [facility](HCC)    Past Surgical History  Procedure Laterality Date  . No past surgeries    . Dilation and evacuation  06/04/2012    Procedure: DILATATION AND EVACUATION;  Surgeon: Bing Plumehomas F Henley, MD;  Location: [facility];  Service: Gynecology;  Laterality: N/A;   Family History  Problem Relation Age of Onset  . Other Neg Hx   . Anxiety disorder Father   . Depression Father   . Anxiety disorder Sister   . Depression Sister   . Anxiety disorder Sister   . OCD Sister   . Depression Mother    Social History  Substance Use Topics  . Smoking status: Current Every Day Smoker -- 1.00 packs/day for 6 years    Types: Cigarettes  . Smokeless tobacco: Never Used  . Alcohol Use: No   OB History    Gravida Para Term Preterm AB TAB SAB Ectopic Multiple Living   2 1 1  0 0 0 0 0 0 1     Review of Systems  Constitutional: Negative.   HENT: Negative.   Respiratory: Negative.   Cardiovascular: Positive for chest pain.  Negative for palpitations and leg swelling.  Gastrointestinal: Negative.   Genitourinary: Negative.   Neurological: Negative.   Psychiatric/Behavioral: The patient is nervous/anxious.   All other systems reviewed and are negative.   Allergies  Review of patient's allergies indicates no known allergies.  Home Medications   Prior to Admission medications   Medication Sig Start Date End Date Taking? Authorizing Provider  ALPRAZolam Prudy Feeler(XANAX) 1 MG tablet Take 1 tablet (1 mg total) by mouth 2 (two) times daily as needed for anxiety. 04/07/15   Thermon LeylandLaura A Davis, NP  citalopram (CELEXA) 40 MG tablet Take 1 tablet (40 mg total) by mouth daily. 04/07/15   Thermon LeylandLaura A Davis, NP  levothyroxine (SYNTHROID, LEVOTHROID) 75 MCG tablet Take 1 tablet (75 mcg total) by mouth daily before breakfast. 04/07/15   Thermon LeylandLaura A Davis, NP  nicotine polacrilex (NICORETTE) 2 MG gum Take 1 each (2 mg total) by mouth as needed for smoking cessation. 04/07/15   Thermon LeylandLaura A Davis, NP   Meds Ordered and Administered this Visit  Medications - No data to display  BP 124/86 mmHg  Pulse 86  Temp(Src) 98.6 F (37 C) (Oral)  Resp 16  SpO2 100%  LMP 09/30/2015 No data found.   Physical Exam  Constitutional: She is oriented to person, place, and time. She appears well-developed and well-nourished. No distress.  Neck: Normal range of motion.  Cardiovascular: Normal rate, regular rhythm, normal heart sounds and intact distal pulses.   Pulmonary/Chest: Effort normal.  Abdominal: Soft. Bowel sounds are normal. She exhibits no distension.  Lymphadenopathy:    She has no cervical adenopathy.  Neurological: She is alert and oriented to person, place, and time.  Skin: Skin is warm and dry.  Nursing note and vitals reviewed.   ED Course  Procedures (including critical care time)  Labs Review Labs Reviewed - No data to display  Imaging Review No results found.   Visual Acuity Review  Right Eye Distance:   Left Eye Distance:    Bilateral Distance:    Right Eye Near:   Left Eye Near:    Bilateral Near:     ED ECG REPORT   Date: 09/30/2015  Rate: 76  Rhythm: normal sinus rhythm  QRS Axis: normal  Intervals: normal  ST/T Wave abnormalities: normal  Conduction Disutrbances:none  Narrative Interpretation:   Old EKG Reviewed: none available  I have personally reviewed the EKG tracing and agree with the computerized printout as noted.     MDM   1. Panic attack as reaction to stress        Linna Hoff, MD 09/30/15 1419

## 2015-09-30 NOTE — ED Notes (Signed)
Pt  Reports  Symptoms  Of  Chest  Pain  And   Short  Of   Breath      This am       Pt  Reports    History  Of  Panic  Attacks  In  Past        Pt  States  She  Feels  Anxious    As  Well

## 2015-09-30 NOTE — Discharge Instructions (Signed)
Use your xanax as needed and call your doctor if issues continue

## 2015-10-04 DIAGNOSIS — E039 Hypothyroidism, unspecified: Secondary | ICD-10-CM | POA: Diagnosis not present

## 2015-10-04 DIAGNOSIS — G2581 Restless legs syndrome: Secondary | ICD-10-CM | POA: Diagnosis not present

## 2015-10-04 DIAGNOSIS — R110 Nausea: Secondary | ICD-10-CM | POA: Diagnosis not present

## 2015-10-04 DIAGNOSIS — F1921 Other psychoactive substance dependence, in remission: Secondary | ICD-10-CM | POA: Diagnosis not present

## 2015-10-23 ENCOUNTER — Other Ambulatory Visit (HOSPITAL_COMMUNITY): Payer: Self-pay | Admitting: Psychiatry

## 2015-10-24 MED FILL — valACYclovir HCL 1 GM TABS: 1 | 30 days supply | Qty: 30 | Fill #4

## 2015-11-05 ENCOUNTER — Telehealth (HOSPITAL_COMMUNITY): Payer: Self-pay | Admitting: *Deleted

## 2015-11-05 NOTE — Telephone Encounter (Signed)
Since she has not been seen since August 2016 no refills will be given until seen. If we have a cancellation we can work her in this week

## 2015-11-05 NOTE — Telephone Encounter (Signed)
patient returned phone call.  She is waiting for your call.

## 2015-11-05 NOTE — Telephone Encounter (Signed)
No refills for controlled med without appt

## 2015-11-05 NOTE — Telephone Encounter (Signed)
Called pt back due to pt mother calling office demanding that office fill pt Xanax because she have been out of her medication and pharmacy stated they faxed it. Also informed pt mother that per HIPAA office was unable to speak with her and pt needed to call and mother stated that she is a Engineer, civil (consulting)nurse and knows about the HIPAA law and she just don't understand that pt have been trying to call office and pharmacy have been trying to contact office and no one is filling pt medication. Per pt mother since Dr. Tenny Crawoss came to that office she have been only trouble for pt and if she have to, she will go above staff head to get pt medication and then hung up. Per pt mother, pt should not have to wait until May 11th to get her Xanax. Informed pt that per HIPAA, she would need to fill out release of information for office to communicate with mother. Also informed pt that office never received any request for refills for her and that just because appointment is made for May, staff did not say that's when she will get her refills. Informed pt that the reason why she was scheduled for May 11th was because that was the soonest office could get her in to see provider and pt have not seen provider since Aug, 2016 and by law she need to f/u with the provider that's prescribing the medication. Informed pt that it is up to the provider to have pt wait until next appt or fill enough to last until appt. Per pt she now understands and will wait for office to call her back. Pt number is [phone]

## 2015-11-05 NOTE — Telephone Encounter (Signed)
Pt called stating she would like refills for her Xanax 1 mg. Pt last appt was 03-14-2015. Pt no showed for her appt on 05-03-2015 and cancelled her appt for 04-11-2015 . Pt number is [phone]. Pt was reminded of the no show policy.

## 2015-11-05 NOTE — Telephone Encounter (Signed)
Pt made appt for 12-05-2015 and would like to know if Dr. Tenny Crawoss would still fill her medication. Pt number is [phone]

## 2015-11-05 NOTE — Telephone Encounter (Signed)
Called pt and informed her of what provider stated and pt showed understanding. Informed pt that there is an open time slot that did come open and pt took time slot for 11-06-15.

## 2015-11-06 ENCOUNTER — Ambulatory Visit (INDEPENDENT_AMBULATORY_CARE_PROVIDER_SITE_OTHER): Payer: 59 | Admitting: Psychiatry

## 2015-11-06 ENCOUNTER — Encounter (HOSPITAL_COMMUNITY): Payer: Self-pay | Admitting: Psychiatry

## 2015-11-06 VITALS — BP 112/69 | HR 74 | Ht 65.0 in | Wt 148.4 lb

## 2015-11-06 DIAGNOSIS — F14129 Cocaine abuse with intoxication, unspecified: Secondary | ICD-10-CM | POA: Diagnosis not present

## 2015-11-06 DIAGNOSIS — F10129 Alcohol abuse with intoxication, unspecified: Secondary | ICD-10-CM | POA: Diagnosis not present

## 2015-11-06 DIAGNOSIS — F3162 Bipolar disorder, current episode mixed, moderate: Secondary | ICD-10-CM | POA: Diagnosis not present

## 2015-11-06 DIAGNOSIS — F11129 Opioid abuse with intoxication, unspecified: Secondary | ICD-10-CM | POA: Diagnosis not present

## 2015-11-06 DIAGNOSIS — F1210 Cannabis abuse, uncomplicated: Secondary | ICD-10-CM | POA: Diagnosis not present

## 2015-11-06 MED ORDER — LAMOTRIGINE 100 MG PO TABS: 100.0000 mg | ORAL_TABLET | Freq: Two times a day (BID) | ORAL | Status: DC

## 2015-11-06 MED ORDER — BUSPIRONE HCL 15 MG PO TABS: 15.0000 mg | ORAL_TABLET | Freq: Three times a day (TID) | ORAL | Status: DC

## 2015-11-06 MED ORDER — PAROXETINE HCL 40 MG PO TABS: 40.0000 mg | ORAL_TABLET | Freq: Every day | ORAL | Status: DC

## 2015-11-06 MED FILL — PARoxetine HCL 40 MG TABS: 40 | 30 days supply | Qty: 30 | Fill #0

## 2015-11-06 MED FILL — busPIRone HCL 15 MG TABS: 15 | 30 days supply | Qty: 90 | Fill #0
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
## 2015-11-21 ENCOUNTER — Ambulatory Visit (HOSPITAL_COMMUNITY): Payer: Self-pay | Admitting: Psychiatry

## 2015-11-21 ENCOUNTER — Encounter (HOSPITAL_COMMUNITY): Payer: Self-pay | Admitting: Psychiatry

## 2015-11-28 DIAGNOSIS — F419 Anxiety disorder, unspecified: Secondary | ICD-10-CM | POA: Diagnosis not present

## 2015-11-28 DIAGNOSIS — F3175 Bipolar disorder, in partial remission, most recent episode depressed: Secondary | ICD-10-CM | POA: Diagnosis not present

## 2015-11-28 MED FILL — busPIRone HCL 15 MG TABS: 15 | 30 days supply | Qty: 90 | Fill #1

## 2015-11-28 MED FILL — valACYclovir HCL 1 GM TABS: 1 | 30 days supply | Qty: 30 | Fill #5

## 2015-11-28 MED FILL — PARoxetine HCL 40 MG TABS: 40 | 30 days supply | Qty: 30 | Fill #0

## 2015-11-28 MED FILL — lamoTRIgine 100 MG TABS: 100 | 90 days supply | Qty: 180 | Fill #1

## 2015-12-05 ENCOUNTER — Ambulatory Visit (HOSPITAL_COMMUNITY): Payer: Self-pay | Admitting: Psychiatry

## 2015-12-12 ENCOUNTER — Ambulatory Visit (HOSPITAL_COMMUNITY)
Admission: EM | Admit: 2015-12-12 | Discharge: 2015-12-12 | Disposition: A | Discharge status: Home / Self Care | Payer: 59 | Attending: Family Medicine | Admitting: Family Medicine

## 2015-12-12 ENCOUNTER — Encounter (HOSPITAL_COMMUNITY): Payer: Self-pay | Admitting: Family Medicine

## 2015-12-12 DIAGNOSIS — A084 Viral intestinal infection, unspecified: Secondary | ICD-10-CM | POA: Diagnosis not present

## 2015-12-12 DIAGNOSIS — N926 Irregular menstruation, unspecified: Secondary | ICD-10-CM

## 2015-12-12 DIAGNOSIS — Z3009 Encounter for other general counseling and advice on contraception: Secondary | ICD-10-CM

## 2015-12-12 LAB — POCT URINALYSIS DIP (DEVICE)
BILIRUBIN URINE: NEGATIVE
Glucose, UA: NEGATIVE mg/dL
Ketones, ur: NEGATIVE mg/dL
LEUKOCYTES UA: NEGATIVE
Nitrite: NEGATIVE
PH: 7 (ref 5.0–8.0)
Protein, ur: NEGATIVE mg/dL
SPECIFIC GRAVITY, URINE: 1.02 (ref 1.005–1.030)
Urobilinogen, UA: 0.2 mg/dL (ref 0.0–1.0)

## 2015-12-12 LAB — POCT PREGNANCY, URINE: Preg Test, Ur: NEGATIVE

## 2015-12-12 MED ORDER — ONDANSETRON 4 MG PO TBDP: 4.0000 mg | ORAL_TABLET | Freq: Once | ORAL | Status: DC

## 2015-12-12 MED ORDER — ONDANSETRON 4 MG PO TBDP
ORAL_TABLET | ORAL | Status: AC
  Filled 2015-12-12: qty 1

## 2015-12-12 NOTE — Discharge Instructions (Signed)
Her symptoms are likely due to a viral gastroenteritis for that infection. This will likely resolve over the course the next 3-4 days. Your pregnancy test was negative today however if you are still early on any possible pregnancy mood and test will not come back positive for another week or 2. Please keep close track of your menstrual periods. Please use birth control. Please take a prenatal vitamin from now until her next period and every time if you're trying to get pregnant or have unprotected sex.

## 2015-12-12 NOTE — ED Provider Notes (Signed)
CSN: [id number]     Arrival date & time 12/12/15  1628 History   None    Chief Complaint  Patient presents with  . Emesis  . Nausea   (Consider location/radiation/quality/duration/timing/severity/associated sxs/prior Treatment) HPI  Nausea, vomiting and dizzy. Ongoing 2 days. Movement and food make it worse. Denies any hematemesis, diarrhea, rash, abdominal pain, dysuria, frequency, flank pain, back pain. Pepto-Bismol without benefit. Patient states at baseline she has bowel movements once a week. Patient initially stated that she has regular periods however when she was asked about that she was unable to state when her last period was when asked how she know she is regular. She says because her mother told her so. Patient is sexually active and uses nothing for contraception. Patient states she does not want to be pregnant but is not doing anything to protect.    Past Medical History  Diagnosis Date  . Anemia   . Infection     UTI  . Abnormal Pap smear     colpo  . Depression   . Mania (HCC)   . Thyroid disease   . 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 Genital herpes   . Anxiety   . Bipolar 1 disorder [facility])    Past Surgical History  Procedure Laterality Date  . No past surgeries    . Dilation and evacuation  06/04/2012    Procedure: DILATATION AND EVACUATION;  Surgeon: Bing Plume, MD;  Location: [facility];  Service: Gynecology;  Laterality: N/A;   Family History  Problem Relation Age of Onset  . Other Neg Hx   . Anxiety disorder Father   . Depression Father   . Anxiety disorder Sister   . Depression Sister   . Anxiety disorder Sister   . OCD Sister   . Depression Mother    Social History  Substance Use Topics  . Smoking status: Current Every Day Smoker -- 0.50 packs/day for 6 years    Types: Cigarettes  . Smokeless tobacco: Never Used  . Alcohol Use: No   OB History    Gravida Para Term Preterm AB TAB SAB Ectopic Multiple Living   0 0 0 0 0 0 1     Review of  Systems Per HPI with all other pertinent systems negative.   Allergies  Nickel  Home Medications   Prior to Admission medications   Medication Sig Start Date End Date Taking? Authorizing Provider  busPIRone (BUSPAR) 15 MG tablet Take 1 tablet (15 mg total) by mouth 3 (three) times daily. 11/06/15  Yes Myrlene Broker, MD  lamoTRIgine (LAMICTAL) 100 MG tablet Take 1 tablet (100 mg total) by mouth 2 (two) times daily. 11/06/15  Yes Myrlene Broker, MD  levothyroxine (SYNTHROID, LEVOTHROID) 75 MCG tablet Take 1 tablet (75 mcg total) by mouth daily before breakfast. 04/07/15  Yes Thermon Leyland, NP  PARoxetine (PAXIL) 40 MG tablet Take 1 tablet (40 mg total) by mouth daily. 11/06/15 11/05/16 Yes Myrlene Broker, MD  valACYclovir (VALTREX) 500 MG tablet Take 500 mg by mouth 2 (two) times daily.   Yes Historical Provider, MD  ondansetron (ZOFRAN-ODT) 4 MG disintegrating tablet Take 1 tablet (4 mg total) by mouth once. 12/12/15   Ozella Rocks, MD   Meds Ordered and Administered this Visit   Medications  ondansetron (ZOFRAN-ODT) disintegrating tablet 4 mg (not administered)    BP 105/70 mmHg  Pulse 106  Temp(Src) 98.2 F (36.8 C) (Oral)  Resp  16  SpO2 97%  LMP 11/19/2015 (Approximate) No data found.   Physical Exam Physical Exam  Constitutional: oriented to person, place, and time. appears well-developed and well-nourished. No distress.  HENT:  Head: Normocephalic and atraumatic.  Eyes: EOMI. PERRL.  Neck: Normal range of motion.  Cardiovascular: RRR, no m/r/g, 2+ distal pulses,  Pulmonary/Chest: Effort normal and breath sounds normal. No respiratory distress.  Abdominal: Soft. Bowel sounds are normal. NonTTP, no distension.  Musculoskeletal: Normal range of motion. Non ttp, no effusion.  Neurological: alert and oriented to person, place, and time.  Skin: Skin is warm. No rash noted. non diaphoretic.  Psychiatric: normal mood and affect. behavior is normal. Judgment and thought content  normal.   ED Course  Procedures (including critical care time)  Labs Review Labs Reviewed  POCT URINALYSIS DIP (DEVICE)  POCT PREGNANCY, URINE    Imaging Review No results found.   Visual Acuity Review  Right Eye Distance:   Left Eye Distance:   Bilateral Distance:    Right Eye Near:   Left Eye Near:    Bilateral Near:         MDM   1. Viral gastroenteritis   2. Missed period   3. General counselling and advice on contraception    UA unremarkable other than blood which is likely from menstruation.  Lengthy counseling provided patient with regards to keep track of her menstrual cycle as well as follow no with her OB/GYN regarding forms of birth control and safe sex practices. Urine pregnancy test at this time was negative though if patient is still early on in her possible pregnancy been I would anticipate this to be negative. Patient again to follow with her OB/GYN for repeat urine pregnancy test if she continues to not cycle. Patient counseled to immediately start using a prenatal vitamin. Patient given 4 mg of Zofran ODT to relieve some of her nausea that she is expected to right now and additional prescriptive for nausea as provided to the patient. If patient's symptoms get significantly worse she's come to the emergency room medially. It does not appear that her symptoms are due to narcotic or EtOH withdrawal she is not using these in several weeks.    Ozella Rocksavid J Terese Heier, MD 12/12/15 [phone]

## 2015-12-12 NOTE — ED Notes (Signed)
Patient presents with vomiting and nausea x2-3 days, patient has taken Pepto bismol for symptoms. No acute distress

## 2015-12-14 ENCOUNTER — Ambulatory Visit (HOSPITAL_COMMUNITY): Admission: EM | Admit: 2015-12-14 | Discharge: 2015-12-14 | Discharge status: Absent without leave | Payer: 59

## 2015-12-14 ENCOUNTER — Encounter (HOSPITAL_COMMUNITY): Payer: Self-pay

## 2015-12-14 ENCOUNTER — Emergency Department (HOSPITAL_COMMUNITY)
Admission: EM | Admit: 2015-12-14 | Discharge: 2015-12-14 | Disposition: A | Discharge status: Home / Self Care | Payer: 59 | Attending: Emergency Medicine | Admitting: Emergency Medicine

## 2015-12-14 DIAGNOSIS — Z8619 Personal history of other infectious and parasitic diseases: Secondary | ICD-10-CM | POA: Insufficient documentation

## 2015-12-14 DIAGNOSIS — G43A1 Cyclical vomiting, intractable: Secondary | ICD-10-CM | POA: Diagnosis not present

## 2015-12-14 DIAGNOSIS — R1013 Epigastric pain: Secondary | ICD-10-CM

## 2015-12-14 DIAGNOSIS — E079 Disorder of thyroid, unspecified: Secondary | ICD-10-CM | POA: Diagnosis not present

## 2015-12-14 DIAGNOSIS — R1110 Vomiting, unspecified: Secondary | ICD-10-CM

## 2015-12-14 DIAGNOSIS — R112 Nausea with vomiting, unspecified: Secondary | ICD-10-CM | POA: Diagnosis not present

## 2015-12-14 DIAGNOSIS — F319 Bipolar disorder, unspecified: Secondary | ICD-10-CM | POA: Diagnosis not present

## 2015-12-14 DIAGNOSIS — K2970 Gastritis, unspecified, without bleeding: Secondary | ICD-10-CM | POA: Diagnosis not present

## 2015-12-14 DIAGNOSIS — F419 Anxiety disorder, unspecified: Secondary | ICD-10-CM | POA: Insufficient documentation

## 2015-12-14 DIAGNOSIS — Z79899 Other long term (current) drug therapy: Secondary | ICD-10-CM | POA: Diagnosis not present

## 2015-12-14 DIAGNOSIS — F17210 Nicotine dependence, cigarettes, uncomplicated: Secondary | ICD-10-CM | POA: Diagnosis not present

## 2015-12-14 DIAGNOSIS — Z87440 Personal history of urinary (tract) infections: Secondary | ICD-10-CM | POA: Diagnosis not present

## 2015-12-14 LAB — URINALYSIS, ROUTINE W REFLEX MICROSCOPIC
Bilirubin Urine: NEGATIVE
GLUCOSE, UA: NEGATIVE mg/dL
KETONES UR: NEGATIVE mg/dL
Nitrite: NEGATIVE
PH: 6 (ref 5.0–8.0)
Protein, ur: NEGATIVE mg/dL
Specific Gravity, Urine: 1.027 (ref 1.005–1.030)

## 2015-12-14 LAB — CBC
HEMATOCRIT: 44.3 % (ref 36.0–46.0)
Hemoglobin: 14.1 g/dL (ref 12.0–15.0)
MCH: 29.3 pg (ref 26.0–34.0)
MCHC: 31.8 g/dL (ref 30.0–36.0)
MCV: 92.1 fL (ref 78.0–100.0)
Platelets: 285 10*3/uL (ref 150–400)
RBC: 4.81 MIL/uL (ref 3.87–5.11)
RDW: 12.9 % (ref 11.5–15.5)
WBC: 9.3 10*3/uL (ref 4.0–10.5)

## 2015-12-14 LAB — COMPREHENSIVE METABOLIC PANEL
ALBUMIN: 4.4 g/dL (ref 3.5–5.0)
ALT: 14 U/L (ref 14–54)
AST: 16 U/L (ref 15–41)
Alkaline Phosphatase: 85 U/L (ref 38–126)
Anion gap: 10 (ref 5–15)
BUN: 10 mg/dL (ref 6–20)
CHLORIDE: 105 mmol/L (ref 101–111)
CO2: 25 mmol/L (ref 22–32)
Calcium: 9.6 mg/dL (ref 8.9–10.3)
Creatinine, Ser: 0.89 mg/dL (ref 0.44–1.00)
GFR calc Af Amer: >60 mL/min (ref >60)
Glucose, Bld: 84 mg/dL (ref 65–99)
POTASSIUM: 4.6 mmol/L (ref 3.5–5.1)
SODIUM: 140 mmol/L (ref 135–145)
Total Bilirubin: 0.6 mg/dL (ref 0.3–1.2)
Total Protein: 7.4 g/dL (ref 6.5–8.1)

## 2015-12-14 LAB — URINE MICROSCOPIC-ADD ON

## 2015-12-14 LAB — LIPASE, BLOOD: LIPASE: 21 U/L (ref 11–51)

## 2015-12-14 MED ORDER — ONDANSETRON HCL 4 MG/2ML IJ SOLN
4.0000 mg | Freq: Once | INTRAMUSCULAR | Status: AC
  Administered 2015-12-14: 4 mg via INTRAVENOUS
  Filled 2015-12-14: qty 2

## 2015-12-14 MED ORDER — SODIUM CHLORIDE 0.9 % IV BOLUS (SEPSIS)
1000.0000 mL | Freq: Once | INTRAVENOUS | Status: AC
  Administered 2015-12-14: 1000 mL via INTRAVENOUS

## 2015-12-14 MED ORDER — ONDANSETRON HCL 8 MG PO TABS
8.0000 mg | ORAL_TABLET | Freq: Three times a day (TID) | ORAL | Status: DC | PRN
Start: 2015-12-14 — End: 2017-01-25

## 2015-12-14 MED ORDER — PANTOPRAZOLE SODIUM 40 MG IV SOLR
40.0000 mg | Freq: Once | INTRAVENOUS | Status: AC
  Administered 2015-12-14: 40 mg via INTRAVENOUS
  Filled 2015-12-14: qty 40

## 2015-12-14 MED ORDER — PANTOPRAZOLE SODIUM 20 MG PO TBEC: 20.0000 mg | DELAYED_RELEASE_TABLET | Freq: Two times a day (BID) | ORAL | Status: DC

## 2015-12-14 NOTE — ED Notes (Signed)
Pt ambulates independently and with steady gait at time of discharge. Discharge instructions and follow up information reviewed with patient. No other questions or concerns voiced at this time.  

## 2015-12-14 NOTE — ED Notes (Signed)
Patient here with ongoing abdominal pain for the past week. Has been seen at urgent care and no relief with zofran. Reports constipation for 1 week as well. States vomiting everything she takes in for same. Concerned that she saw blood in vomit yesterday

## 2015-12-14 NOTE — Discharge Instructions (Signed)
Medication for nausea and mild ulcer. Drink fluids. Follow-up your primary care doctor. You did have a small amount of blood in your urine. This will need to be rechecked.

## 2015-12-14 NOTE — ED Provider Notes (Signed)
CSN: [id number]     Arrival date & time 12/14/15  1736 History   First MD Initiated Contact with Patient 12/14/15 1751     Chief Complaint  Patient presents with  . Abdominal Pain  . Emesis     (Consider location/radiation/quality/duration/timing/severity/associated sxs/prior Treatment) HPI...Marland Kitchen.Marland Kitchen.Epigastric pain, nausea, vomiting for several days. Patient noticed a small amount of blood in her vomitus. She does not take nonsteroidals. No alcohol. No black stool. She is keeping down a small amount of fluid. Severity symptoms is mild to moderate. Nothing makes symptoms better or worse. No history of peptic ulcer disease.  Past Medical History  Diagnosis Date  . Anemia   . Infection     UTI  . Abnormal Pap smear     colpo  . Depression   . Mania (HCC)   . Thyroid disease   . 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 Genital herpes   . Anxiety   . Bipolar 1 disorder [facility](HCC)    Past Surgical History  Procedure Laterality Date  . No past surgeries    . Dilation and evacuation  06/04/2012    Procedure: DILATATION AND EVACUATION;  Surgeon: Bing Plumehomas F Henley, MD;  Location: [facility];  Service: Gynecology;  Laterality: N/A;   Family History  Problem Relation Age of Onset  . Other Neg Hx   . Anxiety disorder Father   . Depression Father   . Anxiety disorder Sister   . Depression Sister   . Anxiety disorder Sister   . OCD Sister   . Depression Mother    Social History  Substance Use Topics  . Smoking status: Current Every Day Smoker -- 0.50 packs/day for 6 years    Types: Cigarettes  . Smokeless tobacco: Never Used  . Alcohol Use: No   OB History    Gravida Para Term Preterm AB TAB SAB Ectopic Multiple Living   2 1 1  0 0 0 0 0 0 1     Review of Systems  All other systems reviewed and are negative.     Allergies  Nickel  Home Medications   Prior to Admission medications   Medication Sig Start Date End Date Taking? Authorizing Provider  busPIRone (BUSPAR) 15 MG tablet Take 1 tablet (15 mg  total) by mouth 3 (three) times daily. 11/06/15  Yes Myrlene Brokereborah R Ross, MD  ibuprofen (ADVIL,MOTRIN) 200 MG tablet Take 400-600 mg by mouth 2 (two) times daily as needed for headache or moderate pain.   Yes Historical Provider, MD  lamoTRIgine (LAMICTAL) 100 MG tablet Take 1 tablet (100 mg total) by mouth 2 (two) times daily. 11/06/15  Yes Myrlene Brokereborah R Ross, MD  levothyroxine (SYNTHROID, LEVOTHROID) 88 MCG tablet Take 88 mcg by mouth daily before breakfast.  10/03/15  Yes Historical Provider, MD  lidocaine (XYLOCAINE) 5 % ointment Apply 1 application topically as needed for mild pain.   Yes Historical Provider, MD  PARoxetine (PAXIL) 40 MG tablet Take 1 tablet (40 mg total) by mouth daily. 11/06/15 11/05/16 Yes Myrlene Brokereborah R Ross, MD  valACYclovir (VALTREX) 1000 MG tablet Take 1 g by mouth every morning.  11/28/15  Yes Historical Provider, MD  ondansetron (ZOFRAN) 8 MG tablet Take 1 tablet (8 mg total) by mouth every 8 (eight) hours as needed for nausea or vomiting. 12/14/15   Donnetta HutchingBrian Nalani Andreen, MD  pantoprazole (PROTONIX) 20 MG tablet Take 1 tablet (20 mg total) by mouth 2 (two) times daily. 12/14/15   Donnetta HutchingBrian Sharde Gover, MD   BP 96/56 mmHg  Pulse 88  Temp(Src) 98.9 F (37.2 C) (Oral)  Resp 18  Ht  (1.676 m)  Wt 145 lb (65.772 kg)  BMI 23.41 kg/m2  SpO2 100%  LMP 11/19/2015 (Approximate) Physical Exam  Constitutional: She is oriented to person, place, and time. She appears well-developed and well-nourished.  HENT:  Head: Normocephalic and atraumatic.  Eyes: Conjunctivae and EOM are normal. Pupils are equal, round, and reactive to light.  Neck: Normal range of motion. Neck supple.  Cardiovascular: Normal rate and regular rhythm.   Pulmonary/Chest: Effort normal and breath sounds normal.  Abdominal: Soft. Bowel sounds are normal.  Minimal epigastric tenderness   Musculoskeletal: Normal range of motion.  Neurological: She is alert and oriented to person, place, and time.  Skin: Skin is warm and dry.  Psychiatric:  She has a normal mood and affect. Her behavior is normal.  Nursing note and vitals reviewed.   ED Course  Procedures (including critical care time) Labs Review Labs Reviewed  URINALYSIS, ROUTINE W REFLEX MICROSCOPIC (NOT AT [facility]) - Abnormal; Notable for the following:    APPearance TURBID (*)    Hgb urine dipstick LARGE (*)    Leukocytes, UA TRACE (*)    All other components within normal limits  URINE MICROSCOPIC-ADD ON - Abnormal; Notable for the following:    Squamous Epithelial / LPF 6-30 (*)    Bacteria, UA MANY (*)    All other components within normal limits  LIPASE, BLOOD  COMPREHENSIVE METABOLIC PANEL  CBC    Imaging Review No results found. I have personally reviewed and evaluated these images and lab results as part of my medical decision-making.   EKG Interpretation None      MDM   Final diagnoses:  Epigastric pain  Intractable vomiting with nausea, vomiting of unspecified type  Gastritis    Patient feels better after 2 L of IV fluids, IV Protonix, IV Zofran. White count and liver functions were normal. Hemoglobin was noted in the urine. Tests were discussed with the patient and her significant other. Discharge medications Protonix 20 mg and Zofran 8 mg.  She will follow-up with her primary care physician    Donnetta Hutching, MD 12/14/15 2213

## 2015-12-18 DIAGNOSIS — R319 Hematuria, unspecified: Secondary | ICD-10-CM | POA: Diagnosis not present

## 2015-12-18 DIAGNOSIS — K219 Gastro-esophageal reflux disease without esophagitis: Secondary | ICD-10-CM | POA: Diagnosis not present

## 2015-12-18 DIAGNOSIS — K5900 Constipation, unspecified: Secondary | ICD-10-CM | POA: Diagnosis not present

## 2015-12-18 MED FILL — ONDANSETRON HCL 8 MG TABLET: 8 | 7 days supply | Qty: 20 | Fill #0

## 2015-12-19 ENCOUNTER — Emergency Department (HOSPITAL_COMMUNITY)
Admission: EM | Admit: 2015-12-19 | Discharge: 2015-12-19 | Discharge status: Against Medical Advice | Payer: 59 | Attending: Emergency Medicine | Admitting: Emergency Medicine

## 2015-12-19 ENCOUNTER — Emergency Department (HOSPITAL_COMMUNITY): Payer: 59

## 2015-12-19 ENCOUNTER — Encounter (HOSPITAL_COMMUNITY): Payer: Self-pay | Admitting: *Deleted

## 2015-12-19 DIAGNOSIS — R1013 Epigastric pain: Secondary | ICD-10-CM | POA: Diagnosis not present

## 2015-12-19 DIAGNOSIS — R109 Unspecified abdominal pain: Secondary | ICD-10-CM

## 2015-12-19 DIAGNOSIS — F319 Bipolar disorder, unspecified: Secondary | ICD-10-CM | POA: Diagnosis not present

## 2015-12-19 DIAGNOSIS — Z79899 Other long term (current) drug therapy: Secondary | ICD-10-CM | POA: Diagnosis not present

## 2015-12-19 DIAGNOSIS — F17210 Nicotine dependence, cigarettes, uncomplicated: Secondary | ICD-10-CM | POA: Diagnosis not present

## 2015-12-19 LAB — COMPREHENSIVE METABOLIC PANEL
ALBUMIN: 4.8 g/dL (ref 3.5–5.0)
ALT: 12 U/L — AB (ref 14–54)
AST: 13 U/L — AB (ref 15–41)
Alkaline Phosphatase: 72 U/L (ref 38–126)
Anion gap: 9 (ref 5–15)
BILIRUBIN TOTAL: 0.3 mg/dL (ref 0.3–1.2)
BUN: 9 mg/dL (ref 6–20)
CO2: 22 mmol/L (ref 22–32)
CREATININE: 0.72 mg/dL (ref 0.44–1.00)
Calcium: 9.3 mg/dL (ref 8.9–10.3)
Chloride: 104 mmol/L (ref 101–111)
GFR calc Af Amer: >60 mL/min (ref >60)
GLUCOSE: 86 mg/dL (ref 65–99)
Potassium: 4 mmol/L (ref 3.5–5.1)
SODIUM: 135 mmol/L (ref 135–145)
TOTAL PROTEIN: 7.9 g/dL (ref 6.5–8.1)

## 2015-12-19 LAB — URINALYSIS, ROUTINE W REFLEX MICROSCOPIC
Bilirubin Urine: NEGATIVE
Glucose, UA: NEGATIVE mg/dL
Ketones, ur: NEGATIVE mg/dL
LEUKOCYTES UA: NEGATIVE
NITRITE: NEGATIVE
PH: 6 (ref 5.0–8.0)
Protein, ur: NEGATIVE mg/dL
Specific Gravity, Urine: 1.025 (ref 1.005–1.030)

## 2015-12-19 LAB — URINE MICROSCOPIC-ADD ON

## 2015-12-19 LAB — PREGNANCY, URINE: PREG TEST UR: NEGATIVE

## 2015-12-19 LAB — LIPASE, BLOOD: LIPASE: 21 U/L (ref 11–51)

## 2015-12-19 NOTE — ED Notes (Signed)
Lab came to nurse and states patient refused to be stuck for labs again. When writer speaks to patient, she is demanding not to be stuck and wants to leave. Offered to let doctor come speak to patient. Patient refused. Patient agrees to sign AMA. Instructed patient to come back if she is worse. Verbalizes understanding.

## 2015-12-19 NOTE — ED Provider Notes (Signed)
CSN: [id number]     Arrival date & time 12/19/15  1948 History  By signing my name below, I, Monica Patel, attest that this documentation has been prepared under the direction and in the presence of Monica OctaveStephen Rilynn Habel, MD. Electronically Signed: Octavia HeirArianna Patel, ED Scribe. 12/19/2015. 8:48 PM.    Chief Complaint  Patient presents with  . Abdominal Pain     The history is provided by the patient. No language interpreter was used.   HPI Comments: Monica Patel is a 25 y.o. female who has a PMHx of anemia, UTI, thyroid disease, genital herpes and bipolar 1 disorder presents to the Emergency Department complaining of constant, gradual worsening, moderate, diffuse, central, abdominal pain with associated nausea, vomiting (x 13 since yesterday) and decreased bowel movements onset one week ago. [facility], and her PCP yesterday for the same symptoms. She was given protonix, tramadol, and zofran to help with her nausea and abdominal pain which has not given her any relief. Pt was told that she might have a stomach ulcer or that she is constipated. There are no modifying or aggravating factors noted. Her last menstrual cycle was 4/25. She denies dysuria, abdominal surgeries, vaginal bleeding, vaginal discharge, or birth control.  Past Medical History  Diagnosis Date  . Anemia   . Infection     UTI  . Abnormal Pap smear     colpo  . Depression   . Mania (HCC)   . Thyroid disease   . 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 Genital herpes   . Anxiety   . Bipolar 1 disorder [facility](HCC)    Past Surgical History  Procedure Laterality Date  . No past surgeries    . Dilation and evacuation  06/04/2012    Procedure: DILATATION AND EVACUATION;  Surgeon: Bing Plumehomas F Henley, MD;  Location: [facility];  Service: Gynecology;  Laterality: N/A;   Family History  Problem Relation Age of Onset  . Other Neg Hx   . Anxiety disorder Father   . Depression Father   . Anxiety disorder Sister   . Depression Sister    . Anxiety disorder Sister   . OCD Sister   . Depression Mother    Social History  Substance Use Topics  . Smoking status: Current Every Day Smoker -- 0.50 packs/day for 6 years    Types: Cigarettes  . Smokeless tobacco: Never Used  . Alcohol Use: No   OB History    Gravida Para Term Preterm AB TAB SAB Ectopic Multiple Living   2 1 1  0 0 0 0 0 0 1     Review of Systems  A complete 10 system review of systems was obtained and all systems are negative except as noted in the HPI and PMH.    Allergies  Nickel  Home Medications   Prior to Admission medications   Medication Sig Start Date End Date Taking? Authorizing Provider  busPIRone (BUSPAR) 15 MG tablet Take 1 tablet (15 mg total) by mouth 3 (three) times daily. 11/06/15  Yes Myrlene Brokereborah R Ross, MD  lamoTRIgine (LAMICTAL) 100 MG tablet Take 1 tablet (100 mg total) by mouth 2 (two) times daily. 11/06/15  Yes Myrlene Brokereborah R Ross, MD  levothyroxine (SYNTHROID, LEVOTHROID) 88 MCG tablet Take 88 mcg by mouth daily before breakfast.  10/03/15  Yes Historical Provider, MD  ondansetron (ZOFRAN) 8 MG tablet Take 1 tablet (8 mg total) by mouth every 8 (eight) hours as needed for  nausea or vomiting. 12/14/15  Yes Donnetta Hutching, MD  pantoprazole (PROTONIX) 20 MG tablet Take 1 tablet (20 mg total) by mouth 2 (two) times daily. 12/14/15  Yes Donnetta Hutching, MD  PARoxetine (PAXIL) 40 MG tablet Take 1 tablet (40 mg total) by mouth daily. 11/06/15 11/05/16 Yes Myrlene Broker, MD  valACYclovir (VALTREX) 1000 MG tablet Take 1 g by mouth every morning.  11/28/15  Yes Historical Provider, MD  ibuprofen (ADVIL,MOTRIN) 200 MG tablet Take 400-600 mg by mouth 2 (two) times daily as needed for headache or moderate pain.    Historical Provider, MD  lidocaine (XYLOCAINE) 5 % ointment Apply 1 application topically as needed for mild pain.    Historical Provider, MD   Triage vitals: BP 107/66 mmHg  Pulse 82  Temp(Src) 98.2 F (36.8 C) (Oral)  Resp 18  Ht  (1.676 m)  Wt 143  lb (64.864 kg)  BMI 23.09 kg/m2  SpO2 100%  LMP 11/19/2015 (Approximate) Physical Exam  Constitutional: She is oriented to person, place, and time. She appears well-developed and well-nourished. No distress.  HENT:  Head: Normocephalic and atraumatic.  Mouth/Throat: Oropharynx is clear and moist. No oropharyngeal exudate.  Eyes: Conjunctivae and EOM are normal. Pupils are equal, round, and reactive to light.  Neck: Normal range of motion. Neck supple.  No meningismus.  Cardiovascular: Normal rate, regular rhythm, normal heart sounds and intact distal pulses.   No murmur heard. Pulmonary/Chest: Effort normal and breath sounds normal. No respiratory distress.  Abdominal: Soft. There is tenderness. There is no rebound and no guarding.  Diffuse tenderness, worse in the epigastrium, no guarding, no rebound, no CVA tenderness  Musculoskeletal: Normal range of motion. She exhibits no edema or tenderness.  Neurological: She is alert and oriented to person, place, and time. No cranial nerve deficit. She exhibits normal muscle tone. Coordination normal.  No ataxia on finger to nose bilaterally. No pronator drift. 5/5 strength throughout. CN 2-12 intact.Equal grip strength. Sensation intact.   Skin: Skin is warm.  Psychiatric: She has a normal mood and affect. Her behavior is normal.  Nursing note and vitals reviewed.   ED Course  Procedures  DIAGNOSTIC STUDIES: Oxygen Saturation is 100% on RA, normal by my interpretation.  COORDINATION OF CARE:  8:46 PM Will order DG abd acute with chest. Discussed treatment plan which includes lab work and urinalysis with pt at bedside and pt agreed to plan.  Labs Review Labs Reviewed  URINALYSIS, ROUTINE W REFLEX MICROSCOPIC (NOT AT [facility]) - Abnormal; Notable for the following:    Hgb urine dipstick SMALL (*)    All other components within normal limits  COMPREHENSIVE METABOLIC PANEL - Abnormal; Notable for the following:    AST 13 (*)    ALT 12 (*)     All other components within normal limits  URINE MICROSCOPIC-ADD ON - Abnormal; Notable for the following:    Squamous Epithelial / LPF 0-5 (*)    Bacteria, UA MANY (*)    All other components within normal limits  PREGNANCY, URINE  LIPASE, BLOOD  CBC WITH DIFFERENTIAL/PLATELET    Imaging Review No results found. I have personally reviewed and evaluated these images and lab results as part of my medical decision-making.   EKG Interpretation None      MDM   Final diagnoses:  Abdominal pain, unspecified abdominal location  recurrent visit for epigastric pain and nausea and vomiting. Has been seen by ED, urgent care and PCP. No fever. States unable  to tolerate by mouth today. Normal bowel movement today.  Will obtain labs and UA.  Xray.  Nursing staff states patient became upset and left the ED.  I was not able to speak with her before she eloped.  Some lab work was obtained and was unremarkable.   I personally performed the services described in this documentation, which was scribed in my presence. The recorded information has been reviewed and is accurate.   Monica Octave, MD 12/20/15 [phone]

## 2015-12-19 NOTE — ED Notes (Signed)
Pt c/o abd pain with n/v, constipation, last "normal" bowel movement was a week ago, has been seen at urgent care and [facility] for same problem

## 2015-12-24 DIAGNOSIS — R112 Nausea with vomiting, unspecified: Secondary | ICD-10-CM | POA: Diagnosis not present

## 2015-12-24 MED FILL — busPIRone HCL 15 MG TABS: 15 | 30 days supply | Qty: 90 | Fill #2

## 2016-01-02 MED FILL — LEVOTHYROXINE 88 MCG TABLET: 88 | 30 days supply | Qty: 30 | Fill #0

## 2016-01-08 DIAGNOSIS — F3173 Bipolar disorder, in partial remission, most recent episode manic: Secondary | ICD-10-CM | POA: Diagnosis not present

## 2016-01-08 MED FILL — PARoxetine HCL 40 MG TABS: 40 | 90 days supply | Qty: 90 | Fill #0

## 2016-01-08 MED FILL — lamoTRIgine 100 MG TABS: 100 | 90 days supply | Qty: 180 | Fill #0

## 2016-01-08 MED FILL — ALPRAZolam 1 MG TABS: 1 | 90 days supply | Qty: 90 | Fill #0

## 2016-02-10 MED FILL — LEVOTHYROXINE 88 MCG TABLET: 88 | 30 days supply | Qty: 30 | Fill #0

## 2016-02-17 MED FILL — valACYclovir HCL 1 GM TABS: 1 | 30 days supply | Qty: 30 | Fill #0

## 2016-03-11 DIAGNOSIS — R21 Rash and other nonspecific skin eruption: Secondary | ICD-10-CM | POA: Diagnosis not present

## 2016-03-11 MED FILL — TRIAMCINOLONE 0.5% OINTMENT: 0.5 | 10 days supply | Qty: 15 | Fill #0

## 2016-03-11 MED FILL — lamoTRIgine 100 MG TABS: 100 | 10 days supply | Qty: 20 | Fill #0

## 2016-03-11 MED FILL — PERMETHRIN 5% CREAM: 5 | 7 days supply | Qty: 60 | Fill #0

## 2016-03-18 DIAGNOSIS — F3173 Bipolar disorder, in partial remission, most recent episode manic: Secondary | ICD-10-CM | POA: Diagnosis not present

## 2016-03-18 MED FILL — ALPRAZolam 1 MG TABS: 1 | 90 days supply | Qty: 180 | Fill #0

## 2016-03-18 MED FILL — lamoTRIgine 150 MG TABS: 150 | 90 days supply | Qty: 180 | Fill #0

## 2016-03-23 MED FILL — valACYclovir HCL 1 GM TABS: 1 | 30 days supply | Qty: 30 | Fill #1

## 2016-03-23 MED FILL — LEVOTHYROXINE 88 MCG TABLET: 88 | 30 days supply | Qty: 30 | Fill #1

## 2016-04-24 DIAGNOSIS — R112 Nausea with vomiting, unspecified: Secondary | ICD-10-CM | POA: Diagnosis not present

## 2016-04-24 MED FILL — ONDANSETRON ODT 4 MG TABLET: 4 | 7 days supply | Qty: 20 | Fill #0

## 2016-04-26 ENCOUNTER — Encounter (HOSPITAL_COMMUNITY): Payer: Self-pay | Admitting: Emergency Medicine

## 2016-04-26 ENCOUNTER — Ambulatory Visit (HOSPITAL_COMMUNITY)
Admission: EM | Admit: 2016-04-26 | Discharge: 2016-04-26 | Disposition: A | Discharge status: Home / Self Care | Payer: 59 | Attending: Family Medicine | Admitting: Family Medicine

## 2016-04-26 DIAGNOSIS — K047 Periapical abscess without sinus: Secondary | ICD-10-CM

## 2016-04-26 DIAGNOSIS — K029 Dental caries, unspecified: Secondary | ICD-10-CM

## 2016-04-26 MED ORDER — TRAMADOL HCL 50 MG PO TABS: 50.0000 mg | ORAL_TABLET | Freq: Four times a day (QID) | ORAL | 0 refills | Status: DC | PRN

## 2016-04-26 MED ORDER — CLINDAMYCIN HCL 300 MG PO CAPS: 300.0000 mg | ORAL_CAPSULE | Freq: Three times a day (TID) | ORAL | 0 refills | Status: AC

## 2016-04-26 MED ORDER — NAPROXEN SODIUM 550 MG PO TABS: 550.0000 mg | ORAL_TABLET | Freq: Two times a day (BID) | ORAL | 0 refills | Status: DC

## 2016-04-26 NOTE — Discharge Instructions (Signed)
Follow up with dentist of your choice.

## 2016-04-26 NOTE — ED Triage Notes (Signed)
[facility] a [facility] of dental pain secondary to a possible dental abscess x 3 days.

## 2016-04-26 NOTE — ED Provider Notes (Signed)
CSN: [id number]     Arrival date & time 04/26/16  1513 History   First MD Initiated Contact with Patient 04/26/16 1614     Chief Complaint  Patient presents with  . Dental Pain   (Consider location/radiation/quality/duration/timing/severity/associated sxs/prior Treatment) HPI History obtained from patient:  Pt presents with the cc of:  Left lower jaw pain Duration of symptoms: 3 days Treatment prior to arrival: No treatment Context: Onset of left dental pain 3 days ago associated swelling of her jaw does not have plans to see a dentist. She states that she is due for an endoscopy next week. Other symptoms include: Hurts to chew on the left side Pain score: 3 FAMILY HISTORY: Anxiety disorder    Past Medical History:  Diagnosis Date  . Abnormal Pap smear    colpo  . 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 Anemia   . Anxiety   . Bipolar 1 disorder (HCC)   . Depression   . Genital herpes   . Infection    UTI  . Mania (HCC)   . Thyroid disease    Past Surgical History:  Procedure Laterality Date  . DILATION AND EVACUATION  06/04/2012   Procedure: DILATATION AND EVACUATION;  Surgeon: Bing Plumehomas F Henley, MD;  Location: [facility];  Service: Gynecology;  Laterality: N/A;  . NO PAST SURGERIES     Family History  Problem Relation Age of Onset  . Other Neg Hx   . Anxiety disorder Father   . Depression Father   . Anxiety disorder Sister   . Depression Sister   . Anxiety disorder Sister   . OCD Sister   . Depression Mother    Social History  Substance Use Topics  . Smoking status: Current Every Day Smoker    Packs/day: 0.50    Years: 6.00    Types: Cigarettes  . Smokeless tobacco: Never Used  . Alcohol use No   OB History    Gravida Para Term Preterm AB Living   2 1 1  0 0 1   SAB TAB Ectopic Multiple Live Births   0 0 0 0 1     Review of Systems  Denies: HEADACHE, NAUSEA, ABDOMINAL PAIN, CHEST PAIN, CONGESTION, DYSURIA, SHORTNESS OF BREATH  Allergies  Nickel  Home Medications    Prior to Admission medications   Medication Sig Start Date End Date Taking? Authorizing Provider  ALPRAZolam Prudy Feeler(XANAX) 1 MG tablet Take 1 mg by mouth 2 (two) times daily as needed for anxiety.   Yes Historical Provider, MD  lamoTRIgine (LAMICTAL) 100 MG tablet Take 1 tablet (100 mg total) by mouth 2 (two) times daily. 11/06/15  Yes Myrlene Brokereborah R Ross, MD  levothyroxine (SYNTHROID, LEVOTHROID) 88 MCG tablet Take 88 mcg by mouth daily before breakfast.  10/03/15  Yes Historical Provider, MD  PARoxetine (PAXIL) 40 MG tablet Take 1 tablet (40 mg total) by mouth daily. 11/06/15 11/05/16 Yes Myrlene Brokereborah R Ross, MD  busPIRone (BUSPAR) 15 MG tablet Take 1 tablet (15 mg total) by mouth 3 (three) times daily. 11/06/15   Myrlene Brokereborah R Ross, MD  ibuprofen (ADVIL,MOTRIN) 200 MG tablet Take 400-600 mg by mouth 2 (two) times daily as needed for headache or moderate pain.    Historical Provider, MD  lidocaine (XYLOCAINE) 5 % ointment Apply 1 application topically as needed for mild pain.    Historical Provider, MD  ondansetron (ZOFRAN) 8 MG tablet Take 1 tablet (8 mg total) by mouth every 8 (eight) hours as needed for nausea or  vomiting. 12/14/15   Donnetta Hutching, MD  pantoprazole (PROTONIX) 20 MG tablet Take 1 tablet (20 mg total) by mouth 2 (two) times daily. 12/14/15   Donnetta Hutching, MD  valACYclovir (VALTREX) 1000 MG tablet Take 1 g by mouth every morning.  11/28/15   Historical Provider, MD   Meds Ordered and Administered this Visit  Medications - No data to display  BP 113/63 (BP Location: Left Arm)   Pulse 82   Temp 98.6 F (37 C) (Oral)   Resp 18   LMP 03/27/2016 (Approximate)   SpO2 100%  No data found.   Physical Exam NURSES NOTES AND VITAL SIGNS REVIEWED. CONSTITUTIONAL: Well developed, well nourished, no acute distress HEENT: normocephalic, atraumatic,  Left lower jaw swelling. Dental caries. No palpable abscess.  EYES: Conjunctiva normal NECK:normal ROM, supple, no adenopathy PULMONARY:No respiratory distress,  normal effort ABDOMINAL: Soft, ND, NT BS+, No CVAT MUSCULOSKELETAL: Normal ROM of all extremities,  SKIN: warm and dry without rash PSYCHIATRIC: Mood and affect, behavior are normal  Urgent Care Course   Clinical Course    Procedures (including critical care time)  Labs Review Labs Reviewed - No data to display  Imaging Review No results found.   Visual Acuity Review  Right Eye Distance:   Left Eye Distance:   Bilateral Distance:    Right Eye Near:   Left Eye Near:    Bilateral Near:         MDM   1. Dental infection   2. Pain due to dental caries     Patient is reassured that there are no issues that require transfer to higher level of care at this time or additional tests. Patient is advised to continue home symptomatic treatment. Patient is advised that if there are new or worsening symptoms to attend the emergency department, contact primary care provider, or return to UC. Instructions of care provided discharged home in stable condition.    THIS NOTE WAS GENERATED USING A VOICE RECOGNITION SOFTWARE PROGRAM. ALL REASONABLE EFFORTS  WERE MADE TO PROOFREAD THIS DOCUMENT FOR ACCURACY.  I have verbally reviewed the discharge instructions with the patient. A printed AVS was given to the patient.  All questions were answered prior to discharge.      Tharon Aquas, PA 04/26/16 [phone]

## 2016-04-27 DIAGNOSIS — R112 Nausea with vomiting, unspecified: Secondary | ICD-10-CM | POA: Diagnosis not present

## 2016-04-27 DIAGNOSIS — R1011 Right upper quadrant pain: Secondary | ICD-10-CM | POA: Diagnosis not present

## 2016-04-27 DIAGNOSIS — R1013 Epigastric pain: Secondary | ICD-10-CM | POA: Diagnosis not present

## 2016-05-13 DIAGNOSIS — F3173 Bipolar disorder, in partial remission, most recent episode manic: Secondary | ICD-10-CM | POA: Diagnosis not present

## 2016-05-29 ENCOUNTER — Emergency Department (HOSPITAL_COMMUNITY)
Admission: EM | Admit: 2016-05-29 | Discharge: 2016-05-29 | Disposition: A | Discharge status: Home / Self Care | Payer: 59 | Attending: Emergency Medicine | Admitting: Emergency Medicine

## 2016-05-29 ENCOUNTER — Encounter (HOSPITAL_COMMUNITY): Payer: Self-pay

## 2016-05-29 ENCOUNTER — Emergency Department (HOSPITAL_COMMUNITY): Payer: 59

## 2016-05-29 DIAGNOSIS — F17210 Nicotine dependence, cigarettes, uncomplicated: Secondary | ICD-10-CM | POA: Diagnosis not present

## 2016-05-29 DIAGNOSIS — R079 Chest pain, unspecified: Secondary | ICD-10-CM | POA: Diagnosis not present

## 2016-05-29 DIAGNOSIS — R072 Precordial pain: Secondary | ICD-10-CM | POA: Diagnosis not present

## 2016-05-29 DIAGNOSIS — Z791 Long term (current) use of non-steroidal anti-inflammatories (NSAID): Secondary | ICD-10-CM | POA: Insufficient documentation

## 2016-05-29 DIAGNOSIS — K219 Gastro-esophageal reflux disease without esophagitis: Secondary | ICD-10-CM | POA: Diagnosis not present

## 2016-05-29 DIAGNOSIS — Z79899 Other long term (current) drug therapy: Secondary | ICD-10-CM | POA: Diagnosis not present

## 2016-05-29 LAB — CBC
HEMATOCRIT: 40.6 % (ref 36.0–46.0)
Hemoglobin: 13.5 g/dL (ref 12.0–15.0)
MCH: 28.7 pg (ref 26.0–34.0)
MCHC: 33.3 g/dL (ref 30.0–36.0)
MCV: 86.2 fL (ref 78.0–100.0)
Platelets: 261 10*3/uL (ref 150–400)
RBC: 4.71 MIL/uL (ref 3.87–5.11)
RDW: 12.6 % (ref 11.5–15.5)
WBC: 8.7 10*3/uL (ref 4.0–10.5)

## 2016-05-29 LAB — BASIC METABOLIC PANEL
Anion gap: 5 (ref 5–15)
BUN: 7 mg/dL (ref 6–20)
CHLORIDE: 104 mmol/L (ref 101–111)
CO2: 24 mmol/L (ref 22–32)
Calcium: 9 mg/dL (ref 8.9–10.3)
Creatinine, Ser: 0.8 mg/dL (ref 0.44–1.00)
GFR calc Af Amer: >60 mL/min (ref >60)
GFR calc non Af Amer: >60 mL/min (ref >60)
GLUCOSE: 95 mg/dL (ref 65–99)
POTASSIUM: 4.4 mmol/L (ref 3.5–5.1)
Sodium: 133 mmol/L — ABNORMAL LOW (ref 135–145)

## 2016-05-29 LAB — TROPONIN I: Troponin I: <0.03 ng/mL (ref <0.03)

## 2016-05-29 MED ORDER — GI COCKTAIL ~~LOC~~
30.0000 mL | Freq: Once | ORAL | Status: AC
  Administered 2016-05-29: 30 mL via ORAL
  Filled 2016-05-29: qty 30

## 2016-05-29 MED ORDER — SUCRALFATE 1 G PO TABS: 1.0000 g | ORAL_TABLET | Freq: Three times a day (TID) | ORAL | 1 refills | Status: DC

## 2016-05-29 NOTE — Discharge Instructions (Signed)
Follow up with your GI doctor as planned

## 2016-05-29 NOTE — ED Provider Notes (Signed)
[facility]PT Provider Note   CSN: [id number] Arrival date & time: 05/29/16  1942     History   Chief Complaint Chief Complaint  Patient presents with  . Chest Pain    HPI Monica Patel is a 25 y.o. female.  Patient complains of epigastric abdominal pain   The history is provided by the patient. No language interpreter was used.  Chest Pain   This is a new problem. The current episode started 6 to 12 hours ago. The problem occurs constantly. The problem has not changed since onset.The pain is associated with eating. The pain is present in the substernal region. The pain is at a severity of 4/10. The pain is moderate. The quality of the pain is described as brief. Pertinent negatives include no abdominal pain, no back pain, no cough and no headaches.  Pertinent negatives for past medical history include no seizures.    Past Medical History:  Diagnosis Date  . Abnormal Pap smear    colpo  . 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 Anemia   . Anxiety   . Bipolar 1 disorder (HCC)   . Depression   . Genital herpes   . Infection    UTI  . Mania (HCC)   . Thyroid disease     Patient Active Problem List   Diagnosis Date Noted  . Bipolar I disorder, most recent episode mixed (HCC)   . Suicide attempt 04/03/2015  . Bipolar 1 disorder, mixed, moderate (HCC) 12/13/2013    Past Surgical History:  Procedure Laterality Date  . DILATION AND EVACUATION  06/04/2012   Procedure: DILATATION AND EVACUATION;  Surgeon: Bing Plume, MD;  Location: [facility];  Service: Gynecology;  Laterality: N/A;  . NO PAST SURGERIES      OB History    Gravida Para Term Preterm AB Living   2 1 1  0 0 1   SAB TAB Ectopic Multiple Live Births   0 0 0 0 1       Home Medications    Prior to Admission medications   Medication Sig Start Date End Date Taking? Authorizing Provider  ALPRAZolam Prudy Feeler) 1 MG tablet Take 1 mg by mouth 2 (two) times daily as needed for anxiety.    Historical Provider, MD    busPIRone (BUSPAR) 15 MG tablet Take 1 tablet (15 mg total) by mouth 3 (three) times daily. 11/06/15   Myrlene Broker, MD  ibuprofen (ADVIL,MOTRIN) 200 MG tablet Take 400-600 mg by mouth 2 (two) times daily as needed for headache or moderate pain.    Historical Provider, MD  lamoTRIgine (LAMICTAL) 100 MG tablet Take 1 tablet (100 mg total) by mouth 2 (two) times daily. 11/06/15   Myrlene Broker, MD  levothyroxine (SYNTHROID, LEVOTHROID) 88 MCG tablet Take 88 mcg by mouth daily before breakfast.  10/03/15   Historical Provider, MD  lidocaine (XYLOCAINE) 5 % ointment Apply 1 application topically as needed for mild pain.    Historical Provider, MD  naproxen sodium (ANAPROX DS) 550 MG tablet Take 1 tablet (550 mg total) by mouth 2 (two) times daily with a meal. 04/26/16   Tharon Aquas, PA  ondansetron (ZOFRAN) 8 MG tablet Take 1 tablet (8 mg total) by mouth every 8 (eight) hours as needed for nausea or vomiting. 12/14/15   Donnetta Hutching, MD  pantoprazole (PROTONIX) 20 MG tablet Take 1 tablet (20 mg total) by mouth 2 (two) times daily. 12/14/15   Donnetta Hutching, MD  PARoxetine (  PAXIL) 40 MG tablet Take 1 tablet (40 mg total) by mouth daily. 11/06/15 11/05/16  Myrlene Brokereborah R Ross, MD  sucralfate (CARAFATE) 1 g tablet Take 1 tablet (1 g total) by mouth 4 (four) times daily -  with meals and at bedtime. 05/29/16   Bethann BerkshireJoseph Cordaro Mukai, MD  traMADol (ULTRAM) 50 MG tablet Take 1 tablet (50 mg total) by mouth every 6 (six) hours as needed. 04/26/16   Tharon AquasFrank C Patrick, PA  valACYclovir (VALTREX) 1000 MG tablet Take 1 g by mouth every morning.  11/28/15   Historical Provider, MD    Family History Family History  Problem Relation Age of Onset  . Anxiety disorder Father   . Depression Father   . Depression Mother   . Anxiety disorder Sister   . Depression Sister   . Anxiety disorder Sister   . OCD Sister   . Other Neg Hx     Social History Social History  Substance Use Topics  . Smoking status: Current Every Day Smoker     Packs/day: 0.50    Years: 6.00    Types: Cigarettes  . Smokeless tobacco: Never Used  . Alcohol use No     Allergies   Nickel   Review of Systems Review of Systems  Constitutional: Negative for appetite change and fatigue.  HENT: Negative for congestion, ear discharge and sinus pressure.   Eyes: Negative for discharge.  Respiratory: Negative for cough.   Cardiovascular: Positive for chest pain.  Gastrointestinal: Negative for abdominal pain and diarrhea.  Genitourinary: Negative for frequency and hematuria.  Musculoskeletal: Negative for back pain.  Skin: Negative for rash.  Neurological: Negative for seizures and headaches.  Psychiatric/Behavioral: Negative for hallucinations.     Physical Exam Updated Vital Signs BP 102/69 (BP Location: Right Arm)   Pulse 73   Temp 98.6 F (37 C) (Oral)   Resp 14   Ht 5\' 6"  (1.676 m)   Wt 150 lb (68 kg)   LMP 04/28/2016   SpO2 100%   BMI 24.21 kg/m   Physical Exam  Constitutional: She is oriented to person, place, and time. She appears well-developed.  HENT:  Head: Normocephalic.  Eyes: Conjunctivae and EOM are normal. No scleral icterus.  Neck: Neck supple. No thyromegaly present.  Cardiovascular: Normal rate and regular rhythm.  Exam reveals no gallop and no friction rub.   No murmur heard. Pulmonary/Chest: No stridor. She has no wheezes. She has no rales. She exhibits no tenderness.  Abdominal: She exhibits no distension. There is tenderness. There is no rebound.  Moderate epigastric tenderness  Musculoskeletal: Normal range of motion. She exhibits no edema.  Lymphadenopathy:    She has no cervical adenopathy.  Neurological: She is oriented to person, place, and time. She exhibits normal muscle tone. Coordination normal.  Skin: No rash noted. No erythema.  Psychiatric: She has a normal mood and affect. Her behavior is normal.     ED Treatments / Results  Labs (all labs ordered are listed, but only abnormal results  are displayed) Labs Reviewed  BASIC METABOLIC PANEL - Abnormal; Notable for the following:       Result Value   Sodium 133 (*)    All other components within normal limits  CBC  TROPONIN I    EKG  EKG Interpretation None       Radiology Dg Chest 2 View  Result Date: 05/29/2016 CLINICAL DATA:  Initial evaluation for acute chest pain. EXAM: CHEST  2 VIEW COMPARISON:  Prior radiograph  from 07/27/2013. FINDINGS: The cardiac and mediastinal silhouettes are stable in size and contour, and remain within normal limits. The lungs are normally inflated. No airspace consolidation, pleural effusion, or pulmonary edema is identified. There is no pneumothorax. No acute osseous abnormality identified. IMPRESSION: No active cardiopulmonary disease. Electronically Signed   By: Rise MuBenjamin  McClintock M.D.   On: 05/29/2016 20:11    Procedures Procedures (including critical care time)  Medications Ordered in ED Medications  gi cocktail (Maalox,Lidocaine,Donnatal) (30 mLs Oral Given 05/29/16 2247)     Initial Impression / Assessment and Plan / ED Course  I have reviewed the triage vital signs and the nursing notes.  Pertinent labs & imaging results that were available during my care of the patient were reviewed by me and considered in my medical decision making (see chart for details).  Clinical Course    Patient symptoms improved with the GI cocktail. She says see a GI doctor for endoscopy. She'll be put on Carafate and will follow-up Final Clinical Impressions(s) / ED Diagnoses   Final diagnoses:  GERD without esophagitis    New Prescriptions New Prescriptions   SUCRALFATE (CARAFATE) 1 G TABLET    Take 1 tablet (1 g total) by mouth 4 (four) times daily -  with meals and at bedtime.     Bethann BerkshireJoseph Noralyn Karim, MD 05/29/16 [phone]

## 2016-05-29 NOTE — ED Triage Notes (Signed)
Reports of chest pain started while laying on couch. States she felt short of breath when started but denies at this time.

## 2016-06-02 DIAGNOSIS — K319 Disease of stomach and duodenum, unspecified: Secondary | ICD-10-CM | POA: Diagnosis not present

## 2016-06-02 DIAGNOSIS — R1013 Epigastric pain: Secondary | ICD-10-CM | POA: Diagnosis not present

## 2016-06-02 DIAGNOSIS — K2990 Gastroduodenitis, unspecified, without bleeding: Secondary | ICD-10-CM | POA: Diagnosis not present

## 2016-06-02 DIAGNOSIS — K3189 Other diseases of stomach and duodenum: Secondary | ICD-10-CM | POA: Diagnosis not present

## 2016-06-02 DIAGNOSIS — R112 Nausea with vomiting, unspecified: Secondary | ICD-10-CM | POA: Diagnosis not present

## 2016-06-02 DIAGNOSIS — R1011 Right upper quadrant pain: Secondary | ICD-10-CM | POA: Diagnosis not present

## 2016-06-09 DIAGNOSIS — K900 Celiac disease: Secondary | ICD-10-CM | POA: Diagnosis not present

## 2016-06-15 MED FILL — LEVOTHYROXINE 88 MCG TABLET: 88 | 30 days supply | Qty: 30 | Fill #2

## 2016-06-16 MED FILL — ALPRAZolam 1 MG TABS: 1 | 90 days supply | Qty: 180 | Fill #0

## 2016-06-16 MED FILL — lamoTRIgine 150 MG TABS: 150 | 90 days supply | Qty: 180 | Fill #0

## 2016-07-02 DIAGNOSIS — H5213 Myopia, bilateral: Secondary | ICD-10-CM | POA: Diagnosis not present

## 2016-07-27 NOTE — L&D Delivery Note (Signed)
Delivery Note Called to check on patient at 4:39 am and she had just received epidural and RN exam 3-4 cm. I was called back at 449am and told she was completely dilated.  I came immediately and arrived by 510am.  Pt then pushed well and at 5:21 AM a viable female was delivered via Vaginal, Spontaneous (Presentation: OA  ).  APGAR: 3, pending by NICU; weight pending .  Immediately following delivery, baby had spontaneous cry and was pink.  He then became floppy and was taken to warmer and NICU called. He responded to O2 and stimulation but needed that consistently to keep O2 sats adequate.  Taken to NICU with dad in attendance after mom held baby briefly.   Placenta status:delivered spontaneously sent to pathology.  Cord:  with the following complications: none.  Cord pH: 7.28 per respiratory therapy (Rob)   Anesthesia:  epidural Episiotomy: None Lacerations:  None Suture Repair: n/a Est. Blood Loss (mL): 175  Mom to postpartum.  Baby to NICU.  Oliver PilaKathy W Mychele Seyller 06/23/2017, 5:53 AM

## 2016-08-27 MED FILL — PARoxetine HCL 40 MG TABS: 40 | 30 days supply | Qty: 30 | Fill #0

## 2016-08-28 MED FILL — lamoTRIgine 150 MG TABS: 150 | 30 days supply | Qty: 60 | Fill #0

## 2017-01-25 ENCOUNTER — Encounter (HOSPITAL_COMMUNITY): Payer: Self-pay | Admitting: *Deleted

## 2017-01-25 ENCOUNTER — Inpatient Hospital Stay (HOSPITAL_COMMUNITY)
Admission: AD | Admit: 2017-01-25 | Discharge: 2017-01-25 | Disposition: A | Discharge status: Home / Self Care | Payer: Medicaid Other | Source: Ambulatory Visit | Attending: Obstetrics & Gynecology | Admitting: Obstetrics & Gynecology

## 2017-01-25 DIAGNOSIS — O209 Hemorrhage in early pregnancy, unspecified: Secondary | ICD-10-CM | POA: Insufficient documentation

## 2017-01-25 DIAGNOSIS — N930 Postcoital and contact bleeding: Secondary | ICD-10-CM

## 2017-01-25 DIAGNOSIS — F419 Anxiety disorder, unspecified: Secondary | ICD-10-CM | POA: Diagnosis not present

## 2017-01-25 DIAGNOSIS — E079 Disorder of thyroid, unspecified: Secondary | ICD-10-CM | POA: Insufficient documentation

## 2017-01-25 DIAGNOSIS — O99281 Endocrine, nutritional and metabolic diseases complicating pregnancy, first trimester: Secondary | ICD-10-CM | POA: Diagnosis not present

## 2017-01-25 DIAGNOSIS — O99341 Other mental disorders complicating pregnancy, first trimester: Secondary | ICD-10-CM | POA: Diagnosis not present

## 2017-01-25 DIAGNOSIS — O26899 Other specified pregnancy related conditions, unspecified trimester: Secondary | ICD-10-CM

## 2017-01-25 DIAGNOSIS — O99331 Smoking (tobacco) complicating pregnancy, first trimester: Secondary | ICD-10-CM | POA: Diagnosis not present

## 2017-01-25 DIAGNOSIS — Z3A11 11 weeks gestation of pregnancy: Secondary | ICD-10-CM | POA: Diagnosis not present

## 2017-01-25 DIAGNOSIS — R109 Unspecified abdominal pain: Secondary | ICD-10-CM

## 2017-01-25 DIAGNOSIS — N939 Abnormal uterine and vaginal bleeding, unspecified: Secondary | ICD-10-CM | POA: Diagnosis present

## 2017-01-25 DIAGNOSIS — F319 Bipolar disorder, unspecified: Secondary | ICD-10-CM | POA: Insufficient documentation

## 2017-01-25 LAB — URINALYSIS, ROUTINE W REFLEX MICROSCOPIC
Bilirubin Urine: NEGATIVE
GLUCOSE, UA: NEGATIVE mg/dL
Ketones, ur: NEGATIVE mg/dL
NITRITE: NEGATIVE
PROTEIN: NEGATIVE mg/dL
SPECIFIC GRAVITY, URINE: 1.017 (ref 1.005–1.030)
pH: 6 (ref 5.0–8.0)

## 2017-01-25 LAB — WET PREP, GENITAL
CLUE CELLS WET PREP: NONE SEEN
Sperm: NONE SEEN
TRICH WET PREP: NONE SEEN
Yeast Wet Prep HPF POC: NONE SEEN

## 2017-01-25 LAB — CBC
HEMATOCRIT: 34.7 % — AB (ref 36.0–46.0)
Hemoglobin: 11.8 g/dL — ABNORMAL LOW (ref 12.0–15.0)
MCH: 29.1 pg (ref 26.0–34.0)
MCHC: 34 g/dL (ref 30.0–36.0)
MCV: 85.5 fL (ref 78.0–100.0)
Platelets: 235 10*3/uL (ref 150–400)
RBC: 4.06 MIL/uL (ref 3.87–5.11)
RDW: 13.2 % (ref 11.5–15.5)
WBC: 9.8 10*3/uL (ref 4.0–10.5)

## 2017-01-25 LAB — POCT PREGNANCY, URINE: Preg Test, Ur: POSITIVE — AB

## 2017-01-25 LAB — HCG, QUANTITATIVE, PREGNANCY: HCG, BETA CHAIN, QUANT, S: 16065 m[IU]/mL — AB (ref <5)

## 2017-01-25 MED ORDER — PRENATAL VITAMINS 0.8 MG PO TABS: 1.0000 | ORAL_TABLET | Freq: Every day | ORAL | 12 refills | Status: AC

## 2017-01-25 NOTE — MAU Note (Signed)
Pt presents to MAU with complaints of lower abdominal cramping with small amount of vaginal bleeding when she wipes that started on THursday

## 2017-01-25 NOTE — Discharge Instructions (Signed)
Vaginal Bleeding During Pregnancy, Second Trimester A small amount of bleeding (spotting) from the vagina is common in pregnancy. Sometimes the bleeding is normal and is not a problem, and sometimes it is a sign of something serious. Be sure to tell your doctor about any bleeding from your vagina right away. Follow these instructions at home:  Watch your condition for any changes.  Follow your doctor's instructions about how active you can be.  If you are on bed rest: ? You may need to stay in bed and only get up to use the bathroom. ? You may be allowed to do some activities. ? If you need help, make plans for someone to help you.  Write down: ? The number of pads you use each day. ? How often you change pads. ? How soaked (saturated) your pads are.  Do not use tampons.  Do not douche.  Do not have sex or orgasms until your doctor says it is okay.  If you pass any tissue from your vagina, save the tissue so you can show it to your doctor.  Only take medicines as told by your doctor.  Do not take aspirin because it can make you bleed.  Do not exercise, lift heavy weights, or do any activities that take a lot of energy and effort unless your doctor says it is okay.  Keep all follow-up visits as told by your doctor. Contact a doctor if:  You bleed from your vagina.  You have cramps.  You have labor pains.  You have a fever that does not go away after you take medicine. Get help right away if:  You have very bad cramps in your back or belly (abdomen).  You have contractions.  You have chills.  You pass large clots or tissue from your vagina.  You bleed more.  You feel light-headed or weak.  You pass out (faint).  You are leaking fluid or have a gush of fluid from your vagina. This information is not intended to replace advice given to you by your health care provider. Make sure you discuss any questions you have with your health care provider. Document  Released: 11/27/2013 Document Revised: 12/19/2015 Document Reviewed: 03/20/2013 Elsevier Interactive Patient Education  2018 [employer]. Second Trimester of Pregnancy The second trimester is from week 14 through week 27 (months 4 through 6). The second trimester is often a time when you feel your best. Your body has adjusted to being pregnant, and you begin to feel better physically. Usually, morning sickness has lessened or quit completely, you may have more energy, and you may have an increase in appetite. The second trimester is also a time when the fetus is growing rapidly. At the end of the sixth month, the fetus is about 9 inches long and weighs about 1 pounds. You will likely begin to feel the baby move (quickening) between 16 and 20 weeks of pregnancy. Body changes during your second trimester Your body continues to go through many changes during your second trimester. The changes vary from woman to woman.  Your weight will continue to increase. You will notice your lower abdomen bulging out.  You may begin to get stretch marks on your hips, abdomen, and breasts.  You may develop headaches that can be relieved by medicines. The medicines should be approved by your health care provider.  You may urinate more often because the fetus is pressing on your bladder.  You may develop or continue to have heartburn as  a result of your pregnancy.  You may develop constipation because certain hormones are causing the muscles that push waste through your intestines to slow down.  You may develop hemorrhoids or swollen, bulging veins (varicose veins).  You may have back pain. This is caused by: ? Weight gain. ? Pregnancy hormones that are relaxing the joints in your pelvis. ? A shift in weight and the muscles that support your balance.  Your breasts will continue to grow and they will continue to become tender.  Your gums may bleed and may be sensitive to brushing and flossing.  Dark  spots or blotches (chloasma, mask of pregnancy) may develop on your face. This will likely fade after the baby is born.  A dark line from your belly button to the pubic area (linea nigra) may appear. This will likely fade after the baby is born.  You may have changes in your hair. These can include thickening of your hair, rapid growth, and changes in texture. Some women also have hair loss during or after pregnancy, or hair that feels dry or thin. Your hair will most likely return to normal after your baby is born.  What to expect at prenatal visits During a routine prenatal visit:  You will be weighed to make sure you and the fetus are growing normally.  Your blood pressure will be taken.  Your abdomen will be measured to track your baby's growth.  The fetal heartbeat will be listened to.  Any test results from the previous visit will be discussed.  Your health care provider may ask you:  How you are feeling.  If you are feeling the baby move.  If you have had any abnormal symptoms, such as leaking fluid, bleeding, severe headaches, or abdominal cramping.  If you are using any tobacco products, including cigarettes, chewing tobacco, and electronic cigarettes.  If you have any questions.  Other tests that may be performed during your second trimester include:  Blood tests that check for: ? Low iron levels (anemia). ? High blood sugar that affects pregnant women (gestational diabetes) between 6624 and 28 weeks. ? Rh antibodies. This is to check for a protein on red blood cells (Rh factor).  Urine tests to check for infections, diabetes, or protein in the urine.  An ultrasound to confirm the proper growth and development of the baby.  An amniocentesis to check for possible genetic problems.  Fetal screens for spina bifida and Down syndrome.  HIV (human immunodeficiency virus) testing. Routine prenatal testing includes screening for HIV, unless you choose not to have this  test.  Follow these instructions at home: Medicines  Follow your health care provider's instructions regarding medicine use. Specific medicines may be either safe or unsafe to take during pregnancy.  Take a prenatal vitamin that contains at least 600 micrograms (mcg) of folic acid.  If you develop constipation, try taking a stool softener if your health care provider approves. Eating and drinking  Eat a balanced diet that includes fresh fruits and vegetables, whole grains, good sources of protein such as meat, eggs, or tofu, and low-fat dairy. Your health care provider will help you determine the amount of weight gain that is right for you.  Avoid raw meat and uncooked cheese. These carry germs that can cause birth defects in the baby.  If you have low calcium intake from food, talk to your health care provider about whether you should take a daily calcium supplement.  Limit foods that are high in  fat and processed sugars, such as fried and sweet foods.  To prevent constipation: ? Drink enough fluid to keep your urine clear or pale yellow. ? Eat foods that are high in fiber, such as fresh fruits and vegetables, whole grains, and beans. Activity  Exercise only as directed by your health care provider. Most women can continue their usual exercise routine during pregnancy. Try to exercise for 30 minutes at least 5 days a week. Stop exercising if you experience uterine contractions.  Avoid heavy lifting, wear low heel shoes, and practice good posture.  A sexual relationship may be continued unless your health care provider directs you otherwise. Relieving pain and discomfort  Wear a good support bra to prevent discomfort from breast tenderness.  Take warm sitz baths to soothe any pain or discomfort caused by hemorrhoids. Use hemorrhoid cream if your health care provider approves.  Rest with your legs elevated if you have leg cramps or low back pain.  If you develop varicose veins,  wear support hose. Elevate your feet for 15 minutes, 3-4 times a day. Limit salt in your diet. Prenatal Care  Write down your questions. Take them to your prenatal visits.  Keep all your prenatal visits as told by your health care provider. This is important. Safety  Wear your seat belt at all times when driving.  Make a list of emergency phone numbers, including numbers for family, friends, the hospital, and police and fire departments. General instructions  Ask your health care provider for a referral to a local prenatal education class. Begin classes no later than the beginning of month 6 of your pregnancy.  Ask for help if you have counseling or nutritional needs during pregnancy. Your health care provider can offer advice or refer you to specialists for help with various needs.  Do not use hot tubs, steam rooms, or saunas.  Do not douche or use tampons or scented sanitary pads.  Do not cross your legs for long periods of time.  Avoid cat litter boxes and soil used by cats. These carry germs that can cause birth defects in the baby and possibly loss of the fetus by miscarriage or stillbirth.  Avoid all smoking, herbs, alcohol, and unprescribed drugs. Chemicals in these products can affect the formation and growth of the baby.  Do not use any products that contain nicotine or tobacco, such as cigarettes and e-cigarettes. If you need help quitting, ask your health care provider.  Visit your dentist if you have not gone yet during your pregnancy. Use a soft toothbrush to brush your teeth and be gentle when you floss. Contact a health care provider if:  You have dizziness.  You have mild pelvic cramps, pelvic pressure, or nagging pain in the abdominal area.  You have persistent nausea, vomiting, or diarrhea.  You have a bad smelling vaginal discharge.  You have pain when you urinate. Get help right away if:  You have a fever.  You are leaking fluid from your  vagina.  You have spotting or bleeding from your vagina.  You have severe abdominal cramping or pain.  You have rapid weight gain or weight loss.  You have shortness of breath with chest pain.  You notice sudden or extreme swelling of your face, hands, ankles, feet, or legs.  You have not felt your baby move in over an hour.  You have severe headaches that do not go away when you take medicine.  You have vision changes. Summary  The second  trimester is from week 14 through week 27 (months 4 through 6). It is also a time when the fetus is growing rapidly.  Your body goes through many changes during pregnancy. The changes vary from woman to woman.  Avoid all smoking, herbs, alcohol, and unprescribed drugs. These chemicals affect the formation and growth your baby.  Do not use any tobacco products, such as cigarettes, chewing tobacco, and e-cigarettes. If you need help quitting, ask your health care provider.  Contact your health care provider if you have any questions. Keep all prenatal visits as told by your health care provider. This is important. This information is not intended to replace advice given to you by your health care provider. Make sure you discuss any questions you have with your health care provider. Document Released: 07/07/2001 Document Revised: 12/19/2015 Document Reviewed: 09/13/2012 Elsevier Interactive Patient Education  2017 [employer]

## 2017-01-25 NOTE — MAU Provider Note (Signed)
Chief Complaint: Vaginal Bleeding   First Provider Initiated Contact with Patient 01/25/17 1554        SUBJECTIVE HPI: Monica Patel is a 26 y.o. G3P1011 at [redacted]w[redacted]d by LMP who presents to maternity admissions reporting lower abdominal cramping and seeing a small amount of red discharge when wiping.. She denies vaginal itching/burning, urinary symptoms, h/a, dizziness, n/v, or fever/chills.   Has not had any prenatal care yet Plans care with Dr Hinton Rao.  Vaginal Bleeding  The patient's primary symptoms include pelvic pain and vaginal bleeding. The patient's pertinent negatives include no genital itching, genital lesions or genital odor. This is a new problem. The current episode started in the past 7 days. The problem occurs intermittently. The problem has been waxing and waning. The pain is mild. The problem affects both sides. She is pregnant. Associated symptoms include abdominal pain. Pertinent negatives include no back pain, constipation, diarrhea, dysuria, fever, nausea or vomiting. The vaginal discharge was bloody and scant. The vaginal bleeding is spotting. She has not been passing clots. She has not been passing tissue. Nothing aggravates the symptoms. She has tried nothing for the symptoms.    RN note: Pt presents to MAU with complaints of lower abdominal cramping with small amount of vaginal bleeding when she wipes that started on THursday  Past Medical History:  Diagnosis Date  . Abnormal Pap smear    colpo  . 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 Anemia   . Anxiety   . Bipolar 1 disorder (HCC)   . Depression   . Genital herpes   . Infection    UTI  . Mania (HCC)   . Thyroid disease    Past Surgical History:  Procedure Laterality Date  . DILATION AND EVACUATION  06/04/2012   Procedure: DILATATION AND EVACUATION;  Surgeon: Bing Plume, MD;  Location: [facility];  Service: Gynecology;  Laterality: N/A;  . NO PAST SURGERIES     Social History   Social History  . Marital status:  Single    Spouse name: N/A  . Number of children: N/A  . Years of education: N/A   Occupational History  . Not on file.   Social History Main Topics  . Smoking status: Current Every Day Smoker    Packs/day: 0.50    Years: 6.00    Types: Cigarettes  . Smokeless tobacco: Never Used  . Alcohol use No  . Drug use: No  . Sexual activity: Yes    Birth control/ protection: None   Other Topics Concern  . Not on file   Social History Narrative  . No narrative on file   No current facility-administered medications on file prior to encounter.    Current Outpatient Prescriptions on File Prior to Encounter  Medication Sig Dispense Refill  . lidocaine (XYLOCAINE) 5 % ointment Apply 1 application topically 2 (two) times daily as needed for mild pain.     . valACYclovir (VALTREX) 1000 MG tablet Take 1,000 mg by mouth daily as needed (for outbreak).   5   Allergies  Allergen Reactions  . Nickel Hives    I have reviewed patient's Past Medical Hx, Surgical Hx, Family Hx, Social Hx, medications and allergies.   ROS:  Review of Systems  Constitutional: Negative for fever.  Gastrointestinal: Positive for abdominal pain. Negative for constipation, diarrhea, nausea and vomiting.  Genitourinary: Positive for pelvic pain and vaginal bleeding. Negative for dysuria.  Musculoskeletal: Negative for back pain.   Review of Systems  Other  systems negative   Physical Exam  Physical Exam Patient Vitals for the past 24 hrs:  BP Pulse Resp Weight  01/25/17 1545 (!) 95/50 68 18 124 lb (56.2 kg)   Constitutional: Well-developed, well-nourished female in no acute distress.  Cardiovascular: normal rate Respiratory: normal effort GI: Abd soft, non-tender. Pos BS x 4 MS: Extremities nontender, no edema, normal ROM Neurologic: Alert and oriented x 4.  GU: Neg CVAT.  PELVIC EXAM: Cervix pink, visually closed, without lesion, scant white creamy discharge, vaginal walls and external genitalia  normal Bimanual exam: Cervix 0/long/high, firm, anterior, neg CMT, uterus nontender, enlarged to 13 wk size, adnexa without tenderness, enlargement, or mass  FHT 160s by bedside US.  CRL c/w 1358w3d, + fetal movement, Normal gestational sac, Unable to see yolk sac  LAB RESULTS Results for orders placed or performed during the hospital encounter of 01/25/17 (from the past 24 hour(s))  CBC     Status: Abnormal   Collection Time: 01/25/17  3:15 PM  Result Value Ref Range   WBC 9.8 4.0 - 10.5 K/uL   RBC 4.06 3.87 - 5.11 MIL/uL   Hemoglobin 11.8 (L) 12.0 - 15.0 g/dL   HCT 29.534.7 (L) 62.136.0 - 30.846.0 %   MCV 85.5 78.0 - 100.0 fL   MCH 29.1 26.0 - 34.0 pg   MCHC 34.0 30.0 - 36.0 g/dL   RDW 65.713.2 84.611.5 - 96.215.5 %   Platelets 235 150 - 400 K/uL  Urinalysis, Routine w reflex microscopic     Status: Abnormal   Collection Time: 01/25/17  3:30 PM  Result Value Ref Range   Color, Urine YELLOW YELLOW   APPearance CLOUDY (A) CLEAR   Specific Gravity, Urine 1.017 1.005 - 1.030   pH 6.0 5.0 - 8.0   Glucose, UA NEGATIVE NEGATIVE mg/dL   Hgb urine dipstick MODERATE (A) NEGATIVE   Bilirubin Urine NEGATIVE NEGATIVE   Ketones, ur NEGATIVE NEGATIVE mg/dL   Protein, ur NEGATIVE NEGATIVE mg/dL   Nitrite NEGATIVE NEGATIVE   Leukocytes, UA SMALL (A) NEGATIVE   RBC / HPF 0-5 0 - 5 RBC/hpf   WBC, UA 0-5 0 - 5 WBC/hpf   Bacteria, UA RARE (A) NONE SEEN   Squamous Epithelial / LPF 6-30 (A) NONE SEEN   Mucous PRESENT   Pregnancy, urine POC     Status: Abnormal   Collection Time: 01/25/17  3:35 PM  Result Value Ref Range   Preg Test, Ur POSITIVE (A) NEGATIVE       IMAGING No results found.  MAU Management/MDM: Reassured by bedside US Cervix is closed and long No blood seen on exam Cultures sent  ASSESSMENT Single IUP at 8785w1d First trimester bleeding, none now Live single intrauterine fetus  PLAN Discharge home Pelvic rest Bleeding precautions Pt stable at time of discharge. Encouraged to return  here or to other Urgent Care/ED if she develops worsening of symptoms, increase in pain, fever, or other concerning symptoms.    Wynelle BourgeoisMarie Sofija Antwi CNM, MSN Certified Nurse-Midwife 01/25/2017  3:55 PM

## 2017-01-26 LAB — GC/CHLAMYDIA PROBE AMP (~~LOC~~) NOT AT ARMC
Chlamydia: NEGATIVE
NEISSERIA GONORRHEA: NEGATIVE

## 2017-01-26 LAB — HIV ANTIBODY (ROUTINE TESTING W REFLEX): HIV SCREEN 4TH GENERATION: NONREACTIVE

## 2017-04-15 ENCOUNTER — Encounter (HOSPITAL_COMMUNITY): Payer: Self-pay | Admitting: Emergency Medicine

## 2017-04-15 ENCOUNTER — Emergency Department (HOSPITAL_COMMUNITY)
Admission: EM | Admit: 2017-04-15 | Discharge: 2017-04-16 | Disposition: A | Discharge status: Home / Self Care | Payer: Medicaid Other | Attending: Emergency Medicine | Admitting: Emergency Medicine

## 2017-04-15 ENCOUNTER — Emergency Department (HOSPITAL_COMMUNITY)
Admission: EM | Admit: 2017-04-15 | Discharge: 2017-04-15 | Disposition: A | Discharge status: Home / Self Care | Payer: Medicaid Other | Source: Home / Self Care

## 2017-04-15 ENCOUNTER — Ambulatory Visit (HOSPITAL_COMMUNITY)
Admission: EM | Admit: 2017-04-15 | Discharge: 2017-04-15 | Disposition: A | Discharge status: Nursing Home (Includes ICF) | Payer: Medicaid Other | Attending: Family Medicine | Admitting: Family Medicine

## 2017-04-15 ENCOUNTER — Ambulatory Visit (HOSPITAL_COMMUNITY): Payer: Medicaid Other

## 2017-04-15 DIAGNOSIS — E079 Disorder of thyroid, unspecified: Secondary | ICD-10-CM | POA: Diagnosis not present

## 2017-04-15 DIAGNOSIS — R1310 Dysphagia, unspecified: Secondary | ICD-10-CM | POA: Diagnosis present

## 2017-04-15 DIAGNOSIS — E039 Hypothyroidism, unspecified: Secondary | ICD-10-CM | POA: Insufficient documentation

## 2017-04-15 DIAGNOSIS — O9989 Other specified diseases and conditions complicating pregnancy, childbirth and the puerperium: Secondary | ICD-10-CM | POA: Diagnosis not present

## 2017-04-15 DIAGNOSIS — F17210 Nicotine dependence, cigarettes, uncomplicated: Secondary | ICD-10-CM | POA: Insufficient documentation

## 2017-04-15 DIAGNOSIS — Z818 Family history of other mental and behavioral disorders: Secondary | ICD-10-CM | POA: Diagnosis not present

## 2017-04-15 DIAGNOSIS — K2100 Gastro-esophageal reflux disease with esophagitis, without bleeding: Secondary | ICD-10-CM

## 2017-04-15 DIAGNOSIS — F419 Anxiety disorder, unspecified: Secondary | ICD-10-CM | POA: Diagnosis not present

## 2017-04-15 DIAGNOSIS — O219 Vomiting of pregnancy, unspecified: Secondary | ICD-10-CM | POA: Insufficient documentation

## 2017-04-15 DIAGNOSIS — Z5321 Procedure and treatment not carried out due to patient leaving prior to being seen by health care provider: Secondary | ICD-10-CM

## 2017-04-15 DIAGNOSIS — Z79899 Other long term (current) drug therapy: Secondary | ICD-10-CM | POA: Insufficient documentation

## 2017-04-15 DIAGNOSIS — O99332 Smoking (tobacco) complicating pregnancy, second trimester: Secondary | ICD-10-CM | POA: Diagnosis not present

## 2017-04-15 DIAGNOSIS — F319 Bipolar disorder, unspecified: Secondary | ICD-10-CM | POA: Insufficient documentation

## 2017-04-15 DIAGNOSIS — Z3A27 27 weeks gestation of pregnancy: Secondary | ICD-10-CM | POA: Diagnosis not present

## 2017-04-15 DIAGNOSIS — O99282 Endocrine, nutritional and metabolic diseases complicating pregnancy, second trimester: Secondary | ICD-10-CM | POA: Diagnosis not present

## 2017-04-15 DIAGNOSIS — J029 Acute pharyngitis, unspecified: Secondary | ICD-10-CM

## 2017-04-15 LAB — POCT RAPID STREP A: Streptococcus, Group A Screen (Direct): NEGATIVE

## 2017-04-15 NOTE — ED Triage Notes (Addendum)
PT reports two days ago she was eating something and a bite of food got caught in her throat. PT reports since then she has difficulty and pain with swallowing and chest discomfort. PT is pregnant

## 2017-04-15 NOTE — ED Notes (Signed)
[facility] is aware that this pt is here. At this time there isn't a need for involvement.

## 2017-04-15 NOTE — ED Provider Notes (Signed)
[facility]   [id number] 04/15/17 Arrival Time: 1213   SUBJECTIVE:  Monica Patel is a 26 y.o. female who presents to the urgent care with complaint of two days ago she was eating something and a bite of food got caught in her throat. PT reports since then she has difficulty and pain with swallowing and chest discomfort.  No fever    Past Medical History:  Diagnosis Date  . Abnormal Pap smear    colpo  . 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 Anemia   . Anxiety   . Bipolar 1 disorder (HCC)   . Depression   . Genital herpes   . Infection    UTI  . Mania (HCC)   . Thyroid disease    Family History  Problem Relation Age of Onset  . Anxiety disorder Father   . Depression Father   . Depression Mother   . Anxiety disorder Sister   . Depression Sister   . Anxiety disorder Sister   . OCD Sister   . Other Neg Hx    Social History   Social History  . Marital status: Single    Spouse name: N/A  . Number of children: N/A  . Years of education: N/A   Occupational History  . Not on file.   Social History Main Topics  . Smoking status: Current Every Day Smoker    Packs/day: 0.50    Years: 6.00    Types: Cigarettes  . Smokeless tobacco: Never Used  . Alcohol use No  . Drug use: No  . Sexual activity: Yes    Birth control/ protection: None   Other Topics Concern  . Not on file   Social History Narrative  . No narrative on file   Current Meds  Medication Sig  . lamoTRIgine (LAMICTAL) 150 MG tablet Take 150 mg by mouth 2 (two) times daily.  Marland Kitchen levothyroxine (SYNTHROID, LEVOTHROID) 100 MCG tablet Take 100 mcg by mouth daily before breakfast.  . LORazepam (ATIVAN) 0.5 MG tablet Take 0.5 mg by mouth at bedtime as needed for anxiety or sleep.  . Prenatal Multivit-Min-Fe-FA (PRENATAL VITAMINS) 0.8 MG tablet Take 1 tablet by mouth daily.  . valACYclovir (VALTREX) 1000 MG tablet Take 1,000 mg by mouth daily as needed (for outbreak).    Allergies  Allergen Reactions  .  Nickel Hives      ROS: As per HPI, remainder of ROS negative.   OBJECTIVE:   Vitals:   04/15/17 1304  BP: 102/63  Pulse: 89  Resp: 16  Temp: 98.4 F (36.9 C)  TempSrc: Oral  SpO2: 98%  Weight: 130 lb (59 kg)  Height:  (1.676 m)     General appearance: alert; no distress Eyes: PERRL; EOMI; conjunctiva normal HENT: normocephalic; atraumatic; TMs normal, canal normal, external ears normal without trauma; nasal mucosa normal; oral mucosa normal Neck: supple Back: no CVA tenderness Extremities: no cyanosis or edema; symmetrical with no gross deformities Skin: warm and dry Neurologic: normal gait; grossly normal Psychological: alert and cooperative; normal mood and affect      Labs:  Results for orders placed or performed during the hospital encounter of 04/15/17  POCT rapid strep A [facility])  Result Value Ref Range   Streptococcus, Group A Screen (Direct) NEGATIVE NEGATIVE    Labs Reviewed  POCT RAPID STREP A    No results found.     ASSESSMENT & PLAN:  1. Dysphagia, unspecified type     We  are unable to image this esophagusbecause you're pregnant. You will need to go to the emergency room for further evaluation. Reviewed expectations re: course of current medical issues. Questions answered. Outlined signs and symptoms indicating need for more acute intervention. Patient verbalized understanding. After Visit Summary given.    Procedures:      Elvina Sidle, MD 04/15/17 1346

## 2017-04-15 NOTE — ED Notes (Signed)
Ice chips given per pt request

## 2017-04-15 NOTE — ED Triage Notes (Signed)
Pt sent from urgent care for possible food bolus, pt feels as if something is in her throat and is painful to swallow. [redacted] weeks pregnant. No abd pain, vaginal bleeding.

## 2017-04-15 NOTE — ED Notes (Addendum)
Fetal Heart Tone 152

## 2017-04-15 NOTE — ED Triage Notes (Signed)
Feels like something is hung in her throat since day before yesterday. Seen at urgent care earlier and was told to come to ed for ultrasound.   Airway patent.

## 2017-04-15 NOTE — Discharge Instructions (Signed)
We are unable to image this esophagusbecause you're pregnant. You will need to go to the emergency room for further evaluation.

## 2017-04-16 MED ORDER — OMEPRAZOLE 20 MG PO CPDR: DELAYED_RELEASE_CAPSULE | ORAL | 0 refills | Status: DC

## 2017-04-16 NOTE — ED Provider Notes (Signed)
[facility]ENCY DEPT Provider Note   CSN: [id number] Arrival date & time: 04/15/17  2133  Time seen 23:45 PM   History   Chief Complaint Chief Complaint  Patient presents with  . Sore Throat    HPI Monica Patel is a 26 y.o. female.  HPI  Patient is G3 P1 Ab1, [redacted] weeks pregnant. She states she is getting her prenatal care from Dr. Ellyn Hack at [facility]. She states she's been having a normal pregnancy although she is having a lot of nausea and vomiting in the later part of her pregnancy. She reports having a lot of heartburn with this pregnancy. She states on September 18 while eating something that she cannot recall she felt like shing got stuck in her throat and she points to the suprasternal notch area. She states since then it has been painful when she eats solids or liquids. She states it's a squeezing sensation. She also describes a lot of heartburn and acid reflux into her throat especially at night when she lays flat. She states when this first happened she did not have any choking or shortness of breath.she states she tried vomiting without relief. She states she is able to eat or ink however it is painful when she does.She is taking Pepcid once a day.She states she is seeing Dr. Elnoria Howard, gastroenterologist in the past because she has celiac disease. She also states she has hypothyroidism and takes Synthroid. She denies any abdominal pain or vaginal bleeding.  PCP Delorse Lek, MD OB Dr Ellyn Hack  Past Medical History:  Diagnosis Date  . Abnormal Pap smear    colpo  . 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 Anemia   . Anxiety   . Bipolar 1 disorder (HCC)   . Depression   . Genital herpes   . Infection    UTI  . Mania (HCC)   . Thyroid disease     Patient Active Problem List   Diagnosis Date Noted  . Bipolar I disorder, most recent episode mixed (HCC)   . Suicide attempt (HCC) 04/03/2015  . Bipolar 1 disorder, mixed, moderate (HCC) 12/13/2013    Past Surgical History:    Procedure Laterality Date  . DILATION AND EVACUATION  06/04/2012   Procedure: DILATATION AND EVACUATION;  Surgeon: Bing Plume, MD;  Location: [facility];  Service: Gynecology;  Laterality: N/A;  . NO PAST SURGERIES      OB History    Gravida Para Term Preterm AB Living   0 1 1   SAB TAB Ectopic Multiple Live Births   1 0 0 0 1       Home Medications    Prior to Admission medications   Medication Sig Start Date End Date Taking? Authorizing Provider  acetaminophen (TYLENOL) 325 MG tablet Take 650 mg by mouth every 6 (six) hours as needed.   Yes [provider]  DICLEGIS 10-10 MG TBEC Take 1-2 tablets by mouth 2 (two) times daily. Take 1 tablet in the morning and 2 tablets at bedtime. 02/18/17  Yes [provider]  diphenhydramine-acetaminophen (TYLENOL PM) 25-500 MG TABS tablet Take 1 tablet by mouth at bedtime as needed (sleep).   Yes [provider]  lamoTRIgine (LAMICTAL) 150 MG tablet Take 150 mg by mouth 2 (two) times daily.   Yes [provider]  levothyroxine (SYNTHROID, LEVOTHROID) 100 MCG tablet Take 100 mcg by mouth daily before breakfast.   Yes [provider]  lidocaine (XYLOCAINE) 5 %  ointment Apply 1 application topically 2 (two) times daily as needed for mild pain.    Yes [provider]  LORazepam (ATIVAN) 0.5 MG tablet Take 0.5 mg by mouth at bedtime as needed for anxiety or sleep.   Yes [provider]  lurasidone (LATUDA) 40 MG TABS tablet Take 1 tablet by mouth daily.   Yes [provider]  Prenatal Multivit-Min-Fe-FA (PRENATAL VITAMINS) 0.8 MG tablet Take 1 tablet by mouth daily. 01/25/17  Yes Aviva Signs, CNM  valACYclovir (VALTREX) 1000 MG tablet Take 1,000 mg by mouth daily as needed (for outbreak).    Yes [provider]  omeprazole (PRILOSEC) 20 MG capsule Take 1 po BID x 2 weeks then once a day 04/16/17   Devoria Albe, MD    Family History Family History  Problem  Relation Age of Onset  . Anxiety disorder Father   . Depression Father   . Depression Mother   . Anxiety disorder Sister   . Depression Sister   . Anxiety disorder Sister   . OCD Sister   . Other Neg Hx     Social History Social History  Substance Use Topics  . Smoking status: Current Every Day Smoker    Packs/day: 0.50    Years: 6.00    Types: Cigarettes  . Smokeless tobacco: Never Used  . Alcohol use No  unemployed Still smokes < 1/2 ppd    Allergies   Nickel   Review of Systems Review of Systems  All other systems reviewed and are negative.    Physical Exam Updated Vital Signs BP 100/63 (BP Location: Right Arm)   Pulse (!) 58   Temp 98.7 F (37.1 C) (Oral)   Resp 20   Ht  (1.676 m)   Wt 59 kg (130 lb)   LMP 11/08/2016   SpO2 100%   BMI 20.98 kg/m   Physical Exam  Constitutional: She is oriented to person, place, and time. She appears well-developed and well-nourished.  Non-toxic appearance. She does not appear ill. No distress.  Pleasant, smiling  HENT:  Head: Normocephalic and atraumatic.  Right Ear: External ear normal.  Left Ear: External ear normal.  Nose: Nose normal. No mucosal edema or rhinorrhea.  Mouth/Throat: Oropharynx is clear and moist and mucous membranes are normal. No dental abscesses or uvula swelling.  Eyes: Pupils are equal, round, and reactive to light. Conjunctivae and EOM are normal.  Neck: Normal range of motion and full passive range of motion without pain. Neck supple.  nontender thyroid gland, the right feels slightly larger than the left, she has no obvious mass or swelling of her neck to observation  Cardiovascular: Normal rate, regular rhythm and normal heart sounds.  Exam reveals no gallop and no friction rub.   No murmur heard. Pulmonary/Chest: Effort normal and breath sounds normal. No respiratory distress. She has no wheezes. She has no rhonchi. She has no rales. She exhibits no tenderness and no crepitus.     Area of discomfort noted  Abdominal: Soft. Normal appearance and bowel sounds are normal. She exhibits no distension. There is no tenderness. There is no rebound and no guarding.  C/W dates, nontender FHT 152  Musculoskeletal: Normal range of motion. She exhibits no edema or tenderness.  Moves all extremities well.   Neurological: She is alert and oriented to person, place, and time. She has normal strength. No cranial nerve deficit.  Skin: Skin is warm, dry and intact. No rash noted. No erythema. No  pallor.  Psychiatric: She has a normal mood and affect. Her speech is normal and behavior is normal. Her mood appears not anxious.  Nursing note and vitals reviewed.    ED Treatments / Results  Labs (all labs ordered are listed, but only abnormal results are displayed) Labs Reviewed - No data to display  EKG  EKG Interpretation None       Radiology No results found.  Procedures Procedures (including critical care time)  Medications Ordered in ED Medications - No data to display   Initial Impression / Assessment and Plan / ED Course  I have reviewed the triage vital signs and the nursing notes.  Pertinent labs & imaging results that were available during my care of the patient were reviewed by me and considered in my medical decision making (see chart for details).    We discussed her systems are consistent with GERD with esophagitis cause of the acid reflux. She is only on Pepcid once a day. She was started on Prilosec twice a day for the next 2 weeks. She is advised to follow-up with Dr. Elnoria Howard her symptoms are improving. We also discussed elevating the head of her bed especially since she states her symptoms are worse at night. I also gave her discharge instructions on GERD such as avoiding peppermint, alcohol, cigarettes, aspirin products.she can take Tums or Maalox between episodes of her Prilosec.If her symptoms continued consideration could be given to starting  Carafate.  Patient was sent to the ED from urgent care to get ultrasound, I explained to her that ultrasound where she is  having discomfort would not be helpful. Her thyroid was nontender.  Final Clinical Impressions(s) / ED Diagnoses   Final diagnoses:  Gastroesophageal reflux disease with esophagitis    New Prescriptions New Prescriptions   OMEPRAZOLE (PRILOSEC) 20 MG CAPSULE    Take 1 po BID x 2 weeks then once a day    Plan discharge  Devoria Albe, MD, Concha Pyo, MD 04/16/17 [phone]

## 2017-04-16 NOTE — Discharge Instructions (Signed)
Start the prilosec twice a day then after 2 weeks once a day. You can take TUMS as needed for heartburn between doses. You can also use Maalox, but if you take it too often you can develop diarrhea. You can let Dr Elnoria Howard know if you aren't improving over the next week. Look at the information about GERD, elevate the head of your bed which will help with the acid reflux at night.  Return to the ED if you have difficulty breathing.

## 2017-04-18 LAB — CULTURE, GROUP A STREP (THRC)

## 2017-06-02 ENCOUNTER — Other Ambulatory Visit: Payer: Self-pay

## 2017-06-02 ENCOUNTER — Inpatient Hospital Stay (HOSPITAL_COMMUNITY)
Admission: AD | Admit: 2017-06-02 | Discharge: 2017-06-02 | Disposition: A | Discharge status: Home / Self Care | Payer: Medicaid Other | Source: Ambulatory Visit | Attending: Obstetrics and Gynecology | Admitting: Obstetrics and Gynecology

## 2017-06-02 ENCOUNTER — Encounter (HOSPITAL_COMMUNITY): Payer: Self-pay

## 2017-06-02 DIAGNOSIS — E039 Hypothyroidism, unspecified: Secondary | ICD-10-CM | POA: Diagnosis not present

## 2017-06-02 DIAGNOSIS — O219 Vomiting of pregnancy, unspecified: Secondary | ICD-10-CM | POA: Diagnosis present

## 2017-06-02 DIAGNOSIS — O99343 Other mental disorders complicating pregnancy, third trimester: Secondary | ICD-10-CM | POA: Diagnosis not present

## 2017-06-02 DIAGNOSIS — F319 Bipolar disorder, unspecified: Secondary | ICD-10-CM | POA: Insufficient documentation

## 2017-06-02 DIAGNOSIS — O212 Late vomiting of pregnancy: Secondary | ICD-10-CM | POA: Diagnosis not present

## 2017-06-02 DIAGNOSIS — Z3A33 33 weeks gestation of pregnancy: Secondary | ICD-10-CM

## 2017-06-02 DIAGNOSIS — O99283 Endocrine, nutritional and metabolic diseases complicating pregnancy, third trimester: Secondary | ICD-10-CM | POA: Diagnosis not present

## 2017-06-02 DIAGNOSIS — F17210 Nicotine dependence, cigarettes, uncomplicated: Secondary | ICD-10-CM | POA: Diagnosis not present

## 2017-06-02 DIAGNOSIS — O99333 Smoking (tobacco) complicating pregnancy, third trimester: Secondary | ICD-10-CM | POA: Insufficient documentation

## 2017-06-02 HISTORY — DX: Celiac disease: K90.0

## 2017-06-02 LAB — URINALYSIS, ROUTINE W REFLEX MICROSCOPIC
Glucose, UA: NEGATIVE mg/dL
Ketones, ur: 80 mg/dL — AB
NITRITE: NEGATIVE
PH: 5 (ref 5.0–8.0)
Protein, ur: 100 mg/dL — AB
SPECIFIC GRAVITY, URINE: 1.025 (ref 1.005–1.030)

## 2017-06-02 LAB — COMPREHENSIVE METABOLIC PANEL
ALT: 12 U/L — AB (ref 14–54)
AST: 17 U/L (ref 15–41)
Albumin: 3.1 g/dL — ABNORMAL LOW (ref 3.5–5.0)
Alkaline Phosphatase: 154 U/L — ABNORMAL HIGH (ref 38–126)
Anion gap: 13 (ref 5–15)
BILIRUBIN TOTAL: 1 mg/dL (ref 0.3–1.2)
BUN: 9 mg/dL (ref 6–20)
CHLORIDE: 102 mmol/L (ref 101–111)
CO2: 21 mmol/L — ABNORMAL LOW (ref 22–32)
CREATININE: 0.65 mg/dL (ref 0.44–1.00)
Calcium: 9 mg/dL (ref 8.9–10.3)
Glucose, Bld: 71 mg/dL (ref 65–99)
POTASSIUM: 4 mmol/L (ref 3.5–5.1)
Sodium: 136 mmol/L (ref 135–145)
TOTAL PROTEIN: 6.5 g/dL (ref 6.5–8.1)

## 2017-06-02 LAB — CBC
HCT: 32.8 % — ABNORMAL LOW (ref 36.0–46.0)
Hemoglobin: 11.3 g/dL — ABNORMAL LOW (ref 12.0–15.0)
MCH: 29.8 pg (ref 26.0–34.0)
MCHC: 34.5 g/dL (ref 30.0–36.0)
MCV: 86.5 fL (ref 78.0–100.0)
PLATELETS: 323 10*3/uL (ref 150–400)
RBC: 3.79 MIL/uL — ABNORMAL LOW (ref 3.87–5.11)
RDW: 13.6 % (ref 11.5–15.5)
WBC: 14.5 10*3/uL — ABNORMAL HIGH (ref 4.0–10.5)

## 2017-06-02 MED ORDER — PROMETHAZINE HCL 25 MG PO TABS: 25.0000 mg | ORAL_TABLET | Freq: Four times a day (QID) | ORAL | 0 refills | Status: DC | PRN

## 2017-06-02 MED ORDER — FAMOTIDINE IN NACL 20-0.9 MG/50ML-% IV SOLN
20.0000 mg | Freq: Once | INTRAVENOUS | Status: AC
  Administered 2017-06-02: 20 mg via INTRAVENOUS
  Filled 2017-06-02: qty 50

## 2017-06-02 MED ORDER — LACTATED RINGERS IV BOLUS (SEPSIS)
1000.0000 mL | Freq: Once | INTRAVENOUS | Status: AC
  Administered 2017-06-02: 1000 mL via INTRAVENOUS

## 2017-06-02 MED ORDER — M.V.I. ADULT IV INJ
INJECTION | Freq: Once | INTRAVENOUS | Status: DC
  Filled 2017-06-02: qty 10

## 2017-06-02 MED ORDER — PROMETHAZINE HCL 25 MG/ML IJ SOLN
25.0000 mg | Freq: Once | INTRAMUSCULAR | Status: AC
  Administered 2017-06-02: 25 mg via INTRAVENOUS
  Filled 2017-06-02: qty 1

## 2017-06-02 MED ORDER — PANTOPRAZOLE SODIUM 40 MG PO TBEC: 40.0000 mg | DELAYED_RELEASE_TABLET | Freq: Two times a day (BID) | ORAL | 1 refills | Status: DC

## 2017-06-02 NOTE — MAU Provider Note (Signed)
History     CSN: [id number]  Arrival date and time: 06/02/17 2009   First Provider Initiated Contact with Patient 06/02/17 2055      Chief Complaint  Patient presents with  . Emesis During Pregnancy   G3P1011 @33 .4 wks here with N/V x2 days. She is unable to tolerate anything po. Sx started after she ran out of Diclegis which she has been using daily during the pregnancy. She tried Dramamine today with no help. No diarrhea. No fevers. Reports good FM. No VB, LOF, or ctx. Her pregnancy has been complicated by HSV, Bipolar, and Hypothyroidism, she is unsure if her meds stayed down today.    OB History    Gravida Para Term Preterm AB Living   3 1 1  0 1 1   SAB TAB Ectopic Multiple Live Births   1 0 0 0 1      Past Medical History:  Diagnosis Date  . Abnormal Pap smear    colpo  . 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 Anemia   . Anxiety   . Bipolar 1 disorder (HCC)   . Celiac disease   . Depression   . Genital herpes   . Infection    UTI  . Mania (HCC)   . Thyroid disease     Past Surgical History:  Procedure Laterality Date  . NO PAST SURGERIES      Family History  Problem Relation Age of Onset  . Anxiety disorder Father   . Depression Father   . Depression Mother   . Anxiety disorder Sister   . Depression Sister   . Anxiety disorder Sister   . OCD Sister   . Other Neg Hx     Social History   Tobacco Use  . Smoking status: Current Every Day Smoker    Packs/day: 0.50    Years: 6.00    Pack years: 3.00    Types: Cigarettes  . Smokeless tobacco: Never Used  Substance Use Topics  . Alcohol use: No  . Drug use: No    Allergies:  Allergies  Allergen Reactions  . Nickel Hives    Medications Prior to Admission  Medication Sig Dispense Refill Last Dose  . DICLEGIS 10-10 MG TBEC Take 1-2 tablets by mouth 2 (two) times daily. Take 1 tablet in the morning and 2 tablets at bedtime.  2 06/02/2017 at Unknown time  . diphenhydramine-acetaminophen (TYLENOL PM) 25-500 MG  TABS tablet Take 1 tablet by mouth at bedtime as needed (sleep).   06/01/2017 at Unknown time  . lamoTRIgine (LAMICTAL) 150 MG tablet Take 150 mg by mouth 2 (two) times daily.   06/02/2017 at Unknown time  . levothyroxine (SYNTHROID, LEVOTHROID) 100 MCG tablet Take 100 mcg by mouth daily before breakfast.   06/02/2017 at Unknown time  . Prenatal Multivit-Min-Fe-FA (PRENATAL VITAMINS) 0.8 MG tablet Take 1 tablet by mouth daily. 30 tablet 12 06/02/2017 at Unknown time  . valACYclovir (VALTREX) 1000 MG tablet Take 1,000 mg by mouth daily as needed (for outbreak).   5 06/02/2017 at Unknown time  . acetaminophen (TYLENOL) 325 MG tablet Take 650 mg by mouth every 6 (six) hours as needed.   unknown  . lidocaine (XYLOCAINE) 5 % ointment Apply 1 application topically 2 (two) times daily as needed for mild pain.    More than a month at Unknown time  . LORazepam (ATIVAN) 0.5 MG tablet Take 0.5 mg by mouth at bedtime as needed for anxiety or sleep.   More  than a month at Unknown time  . lurasidone (LATUDA) 40 MG TABS tablet Take 1 tablet by mouth daily.   More than a month at Unknown time  . omeprazole (PRILOSEC) 20 MG capsule Take 1 po BID x 2 weeks then once a day 60 capsule 0 More than a month at Unknown time    Review of Systems  Constitutional: Negative for fever.  Gastrointestinal: Positive for constipation, nausea and vomiting. Negative for abdominal pain and diarrhea.  Genitourinary: Negative for dysuria, frequency, hematuria and urgency.   Physical Exam   Blood pressure (!) 105/53, pulse (!) 111, temperature 98.4 F (36.9 C), temperature source Oral, resp. rate 20, height 5\' 6"  (1.676 m), weight 142 lb (64.4 kg), last menstrual period 11/08/2016, SpO2 94 %.  Physical Exam  Constitutional: She is oriented to person, place, and time. She appears well-developed and well-nourished. No distress.  HENT:  Head: Normocephalic and atraumatic.  Neck: Normal range of motion.  Respiratory: Effort normal. No  respiratory distress.  GI: Soft. She exhibits no distension. There is no tenderness. There is no CVA tenderness.  gravid  Musculoskeletal: Normal range of motion.  Neurological: She is alert and oriented to person, place, and time.  Skin: Skin is warm and dry.  Psychiatric: She has a normal mood and affect.  EFM: 125 bpm, mod variability, + accels, no decels Toco: rare  Results for orders placed or performed during the hospital encounter of 06/02/17 (from the past 24 hour(s))  Urinalysis, Routine w reflex microscopic     Status: Abnormal   Collection Time: 06/02/17  8:24 PM  Result Value Ref Range   Color, Urine AMBER (A) YELLOW   APPearance CLOUDY (A) CLEAR   Specific Gravity, Urine 1.025 1.005 - 1.030   pH 5.0 5.0 - 8.0   Glucose, UA NEGATIVE NEGATIVE mg/dL   Hgb urine dipstick MODERATE (A) NEGATIVE   Bilirubin Urine SMALL (A) NEGATIVE   Ketones, ur 80 (A) NEGATIVE mg/dL   Protein, ur 098100 (A) NEGATIVE mg/dL   Nitrite NEGATIVE NEGATIVE   Leukocytes, UA LARGE (A) NEGATIVE   RBC / HPF 6-30 0 - 5 RBC/hpf   WBC, UA TOO NUMEROUS TO COUNT 0 - 5 WBC/hpf   Bacteria, UA MANY (A) NONE SEEN   Squamous Epithelial / LPF 6-30 (A) NONE SEEN   Mucus PRESENT   CBC     Status: Abnormal   Collection Time: 06/02/17  9:18 PM  Result Value Ref Range   WBC 14.5 (H) 4.0 - 10.5 K/uL   RBC 3.79 (L) 3.87 - 5.11 MIL/uL   Hemoglobin 11.3 (L) 12.0 - 15.0 g/dL   HCT 11.932.8 (L) 14.736.0 - 82.946.0 %   MCV 86.5 78.0 - 100.0 fL   MCH 29.8 26.0 - 34.0 pg   MCHC 34.5 30.0 - 36.0 g/dL   RDW 56.213.6 13.011.5 - 86.515.5 %   Platelets 323 150 - 400 K/uL   MAU Course  Procedures LR 1 L Phenergan Pepcid  MDM Labs ordered and reviewed. UA with TNTC WBC and many bacteria, specimen likely contaminated but will send UC. Presentation, clinical findings, and plan discussed with Dr. Ellyn HackBovard, agrees.  Transfer of care given to Dr. Latanya Maudlinegele Bhambri, Central[place], CNM  06/02/2017 9:56 PM   11:02 PM -- Symptoms improved on re-evaluation.  Passed po trial.  Assessment and Plan   1. Nausea/vomiting in pregnancy    --D/c home in stable condition and precautions. --Continue Diclegis at home --Rx for Protonix BID and phenergan  prn  Kandra Nicolas. Deserie Dirks, MD OB Fellow

## 2017-06-02 NOTE — MAU Note (Signed)
Vomiting x 2 days, vomited 10 times today, not keeping anything down.  Been having vomiting on and off through pregnancy but ran out of Diclegis.  Finally got Diclegis 2 hours ago and tried Dramamine but didn't help. No vaginal bleeding. No leaking. Baby moving well.

## 2017-06-04 LAB — CULTURE, OB URINE

## 2017-06-22 ENCOUNTER — Other Ambulatory Visit: Payer: Self-pay

## 2017-06-22 ENCOUNTER — Encounter (HOSPITAL_COMMUNITY): Payer: Self-pay | Admitting: *Deleted

## 2017-06-22 ENCOUNTER — Inpatient Hospital Stay (HOSPITAL_COMMUNITY)
Admission: AD | Admit: 2017-06-22 | Discharge: 2017-06-23 | DRG: 806 | Disposition: A | Discharge status: Home / Self Care | Payer: Medicaid Other | Source: Ambulatory Visit | Attending: Obstetrics and Gynecology | Admitting: Obstetrics and Gynecology

## 2017-06-22 DIAGNOSIS — F419 Anxiety disorder, unspecified: Secondary | ICD-10-CM | POA: Diagnosis present

## 2017-06-22 DIAGNOSIS — F17210 Nicotine dependence, cigarettes, uncomplicated: Secondary | ICD-10-CM | POA: Diagnosis present

## 2017-06-22 DIAGNOSIS — O42913 Preterm premature rupture of membranes, unspecified as to length of time between rupture and onset of labor, third trimester: Principal | ICD-10-CM | POA: Diagnosis present

## 2017-06-22 DIAGNOSIS — Z3A36 36 weeks gestation of pregnancy: Secondary | ICD-10-CM

## 2017-06-22 DIAGNOSIS — E039 Hypothyroidism, unspecified: Secondary | ICD-10-CM | POA: Diagnosis present

## 2017-06-22 DIAGNOSIS — O99344 Other mental disorders complicating childbirth: Secondary | ICD-10-CM | POA: Diagnosis present

## 2017-06-22 DIAGNOSIS — A6000 Herpesviral infection of urogenital system, unspecified: Secondary | ICD-10-CM | POA: Diagnosis present

## 2017-06-22 DIAGNOSIS — F319 Bipolar disorder, unspecified: Secondary | ICD-10-CM | POA: Diagnosis present

## 2017-06-22 DIAGNOSIS — O99284 Endocrine, nutritional and metabolic diseases complicating childbirth: Secondary | ICD-10-CM | POA: Diagnosis present

## 2017-06-22 DIAGNOSIS — O4290 Premature rupture of membranes, unspecified as to length of time between rupture and onset of labor, unspecified weeks of gestation: Secondary | ICD-10-CM | POA: Diagnosis present

## 2017-06-22 DIAGNOSIS — O99334 Smoking (tobacco) complicating childbirth: Secondary | ICD-10-CM | POA: Diagnosis present

## 2017-06-22 DIAGNOSIS — O9832 Other infections with a predominantly sexual mode of transmission complicating childbirth: Secondary | ICD-10-CM | POA: Diagnosis present

## 2017-06-22 HISTORY — DX: Scoliosis, unspecified: M41.9

## 2017-06-22 HISTORY — DX: Unspecified abnormal cytological findings in specimens from vagina: R87.629

## 2017-06-22 HISTORY — DX: Schizoaffective disorder, unspecified: F25.9

## 2017-06-22 HISTORY — DX: Schizoaffective disorder, bipolar type: F25.0

## 2017-06-22 HISTORY — DX: Post-traumatic stress disorder, unspecified: F43.10

## 2017-06-22 LAB — CBC
HCT: 32.1 % — ABNORMAL LOW (ref 36.0–46.0)
Hemoglobin: 10.5 g/dL — ABNORMAL LOW (ref 12.0–15.0)
MCH: 29.4 pg (ref 26.0–34.0)
MCHC: 32.7 g/dL (ref 30.0–36.0)
MCV: 89.9 fL (ref 78.0–100.0)
PLATELETS: 409 10*3/uL — AB (ref 150–400)
RBC: 3.57 MIL/uL — AB (ref 3.87–5.11)
RDW: 14.1 % (ref 11.5–15.5)
WBC: 13.8 10*3/uL — AB (ref 4.0–10.5)

## 2017-06-22 LAB — OB RESULTS CONSOLE HEPATITIS B SURFACE ANTIGEN
HEP B S AG: NEGATIVE
Hepatitis B Surface Ag: NEGATIVE

## 2017-06-22 LAB — TYPE AND SCREEN
ABO/RH(D): O POS
ANTIBODY SCREEN: NEGATIVE

## 2017-06-22 LAB — POCT FERN TEST: POCT FERN TEST: POSITIVE

## 2017-06-22 LAB — OB RESULTS CONSOLE RPR: RPR: NONREACTIVE

## 2017-06-22 LAB — OB RESULTS CONSOLE HIV ANTIBODY (ROUTINE TESTING): HIV: NONREACTIVE

## 2017-06-22 LAB — OB RESULTS CONSOLE RUBELLA ANTIBODY, IGM: Rubella: IMMUNE

## 2017-06-22 LAB — OB RESULTS CONSOLE GC/CHLAMYDIA
Chlamydia: NEGATIVE
GC PROBE AMP, GENITAL: NEGATIVE

## 2017-06-22 LAB — TSH: TSH: 4.634 u[IU]/mL — AB (ref 0.350–4.500)

## 2017-06-22 LAB — T4, FREE: FREE T4: 0.89 ng/dL (ref 0.61–1.12)

## 2017-06-22 MED ORDER — PROMETHAZINE HCL 25 MG/ML IJ SOLN: 12.5000 mg | Freq: Four times a day (QID) | INTRAMUSCULAR | Status: DC | PRN

## 2017-06-22 MED ORDER — LAMOTRIGINE 150 MG PO TABS
150.0000 mg | ORAL_TABLET | Freq: Two times a day (BID) | ORAL | Status: DC
  Administered 2017-06-22: 150 mg via ORAL
  Filled 2017-06-22 (×4): qty 1

## 2017-06-22 MED ORDER — ONDANSETRON HCL 4 MG/2ML IJ SOLN: 4.0000 mg | Freq: Four times a day (QID) | INTRAMUSCULAR | Status: DC | PRN

## 2017-06-22 MED ORDER — SOD CITRATE-CITRIC ACID 500-334 MG/5ML PO SOLN
30.0000 mL | ORAL | Status: DC | PRN
  Administered 2017-06-23: 30 mL via ORAL
  Filled 2017-06-22: qty 15

## 2017-06-22 MED ORDER — OXYTOCIN 40 UNITS IN LACTATED RINGERS INFUSION - SIMPLE MED
2.5000 [IU]/h | INTRAVENOUS | Status: DC
  Administered 2017-06-23 (×2): 2.5 [IU]/h via INTRAVENOUS

## 2017-06-22 MED ORDER — ACETAMINOPHEN 325 MG PO TABS
650.0000 mg | ORAL_TABLET | ORAL | Status: DC | PRN
  Administered 2017-06-22: 650 mg via ORAL
  Filled 2017-06-22: qty 2

## 2017-06-22 MED ORDER — LEVOTHYROXINE SODIUM 100 MCG PO TABS
100.0000 ug | ORAL_TABLET | Freq: Every day | ORAL | Status: DC
  Filled 2017-06-22: qty 1

## 2017-06-22 MED ORDER — TERBUTALINE SULFATE 1 MG/ML IJ SOLN
0.2500 mg | Freq: Once | INTRAMUSCULAR | Status: DC | PRN
  Filled 2017-06-22: qty 1

## 2017-06-22 MED ORDER — PANTOPRAZOLE SODIUM 40 MG PO TBEC: 40.0000 mg | DELAYED_RELEASE_TABLET | Freq: Two times a day (BID) | ORAL | Status: DC

## 2017-06-22 MED ORDER — LURASIDONE HCL 40 MG PO TABS
40.0000 mg | ORAL_TABLET | Freq: Every day | ORAL | Status: DC
  Filled 2017-06-22 (×2): qty 1

## 2017-06-22 MED ORDER — OXYCODONE-ACETAMINOPHEN 5-325 MG PO TABS: 1.0000 | ORAL_TABLET | ORAL | Status: DC | PRN

## 2017-06-22 MED ORDER — LEVOTHYROXINE SODIUM 125 MCG PO TABS
125.0000 ug | ORAL_TABLET | Freq: Every day | ORAL | Status: DC
  Administered 2017-06-23: 125 ug via ORAL
  Filled 2017-06-22 (×2): qty 1

## 2017-06-22 MED ORDER — OXYTOCIN BOLUS FROM INFUSION
500.0000 mL | Freq: Once | INTRAVENOUS | Status: AC
  Administered 2017-06-23: 500 mL via INTRAVENOUS

## 2017-06-22 MED ORDER — BUTORPHANOL TARTRATE 1 MG/ML IJ SOLN
1.0000 mg | INTRAMUSCULAR | Status: DC | PRN
  Administered 2017-06-23 (×2): 1 mg via INTRAVENOUS
  Filled 2017-06-22 (×2): qty 1

## 2017-06-22 MED ORDER — LIDOCAINE HCL (PF) 1 % IJ SOLN
30.0000 mL | INTRAMUSCULAR | Status: DC | PRN
Start: 2017-06-22 — End: 2017-06-23
  Filled 2017-06-22: qty 30

## 2017-06-22 MED ORDER — LACTATED RINGERS IV SOLN
INTRAVENOUS | Status: DC
  Administered 2017-06-22 (×2): via INTRAVENOUS

## 2017-06-22 MED ORDER — OXYCODONE-ACETAMINOPHEN 5-325 MG PO TABS: 2.0000 | ORAL_TABLET | ORAL | Status: DC | PRN

## 2017-06-22 MED ORDER — OXYTOCIN 40 UNITS IN LACTATED RINGERS INFUSION - SIMPLE MED
1.0000 m[IU]/min | INTRAVENOUS | Status: DC
  Administered 2017-06-22: 2 m[IU]/min via INTRAVENOUS
  Filled 2017-06-22: qty 1000

## 2017-06-22 MED ORDER — VALACYCLOVIR HCL 500 MG PO TABS
1000.0000 mg | ORAL_TABLET | Freq: Every day | ORAL | Status: DC | PRN
  Filled 2017-06-22: qty 2

## 2017-06-22 MED ORDER — LACTATED RINGERS IV SOLN
500.0000 mL | INTRAVENOUS | Status: DC | PRN
  Administered 2017-06-23: 500 mL via INTRAVENOUS

## 2017-06-22 NOTE — Progress Notes (Signed)
Patient ID: Monica Patel, female   DOB: 1990-11-24, 26 y.o.   MRN: [id number] Pt still feeling minimal contractions and in L&D room now She states she has stopped the latuda and omeprazole  afeb vss FHR category 1, had one variable related to position  30/1-2/-3  Ballotable  Will begin pitocin and see how responds Epidural when active

## 2017-06-22 NOTE — MAU Note (Signed)
Started leaking clear fluid around 5:30.  Has a towel between her legs. Fluid continues. No bleeding.  Some back ache

## 2017-06-22 NOTE — H&P (Signed)
Lisette Grindermily C Enberg is a 26 y.o. female G3P1011 at 5436 3/7 weeks (EDD 07/17/17 by 17 week US and unsure LMP) presenting for SROM occurring at home about 1730pm.  She reports minimal contractions currently. Prenatal care complicated by hypothyroidism.  Last labs 04/28/17 with TSH 16 and Free T4 low, pt was instructed to increase dose to 125mcg, but labs were never repeated, so will recheck today. She has a hx of mental illness with various diagnoses but has been stable on latuda and lamictal this pregnancy.  She is a smoker of 1/2-1 ppd.  She has a h/o herpes but has been on suppression the entire pregnancy.  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  She was late to care at 15 weeks.    OB History    Gravida Para Term Preterm AB Living   3 1 1  0 1 1   SAB TAB Ectopic Multiple Live Births   1 0 0 0 1    2012 NSVD 8#5oz 2013 SAB x 1 with D&C  Past Medical History:  Diagnosis Date  . Abnormal Pap smear    colpo  . 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 Anemia   . Anxiety   . Bipolar 1 disorder (HCC)   . Celiac disease   . Depression   . Genital herpes   . Infection    UTI  . Mania (HCC)   . Thyroid disease    Past Surgical History:  Procedure Laterality Date  . DILATION AND EVACUATION  06/04/2012   Procedure: DILATATION AND EVACUATION;  Surgeon: Bing Plumehomas F Henley, MD;  Location: [facility];  Service: Gynecology;  Laterality: N/A;  . NO PAST SURGERIES     Family History: family history includes Anxiety disorder in her father, sister, and sister; Depression in her father, mother, and sister; OCD in her sister. Social History:  reports that she has been smoking cigarettes.  She has a 3.00 pack-year smoking history. she has never used smokeless tobacco. She reports that she does not drink alcohol or use drugs.     Maternal Diabetes: No Genetic Screening: Normal tetra screen  Maternal Ultrasounds/Referrals: Normal Fetal Ultrasounds or other Referrals:  None Maternal Substance Abuse:   Yes:  Type: Smoker Significant Maternal Medications:  Meds include: Other:  latuda, lamictal, valtrex, levothyroxine Significant Maternal Lab Results:  None Other Comments:  None  Review of Systems  Gastrointestinal: Negative for abdominal pain.  Neurological: Negative for headaches.   Maternal Medical History:  Reason for admission: Rupture of membranes.   Contractions: Onset was 1-2 hours ago.   Frequency: irregular.   Perceived severity is mild.    Fetal activity: Perceived fetal activity is normal.    Prenatal complications: No bleeding.   Prenatal Complications - Diabetes: none.    Dilation: 1 Effacement (%): Thick Station: Ballotable Exam by:: Janeth Rasehristina Robinson RN Blood pressure 106/65, pulse (!) 104, temperature 98.6 F (37 C), temperature source Oral, last menstrual period 11/08/2016, SpO2 98 %. Maternal Exam:  Uterine Assessment: Contraction strength is mild.  Contraction frequency is irregular.   Abdomen: Patient reports no abdominal tenderness. Fetal presentation: vertex  Introitus: Normal vulva. Normal vagina.  Ferning test: positive.  Amniotic fluid character: clear.  Pelvis: adequate for delivery.     FHR category 1 Physical Exam  Constitutional: She appears well-developed and well-nourished.  Cardiovascular: Normal rate and regular rhythm.  Respiratory: Effort normal.  GI: Soft.  Genitourinary: Vagina normal.  Neurological: She  is alert.  Psychiatric: She has a normal mood and affect.    Prenatal labs: ABO, Rh:  O positive Antibody:  negative Rubella:  Immune RPR:  positive, treponemal negative  HBsAg:   Neg HIV:   NR GBS:   Neg  One hour GCT 106 Tetra negative SMA, Fragile X Carrier negative  Assessment/Plan: Pt admitted with PROM and no significant contractions.  Will augment with pitocin.  Check thyroid labs on admission on dose increase since early October. Epidural prn.  D/w pt possible need for NICU support with baby preterm and  dating by 17 week US.   Oliver PilaKathy W Mikail Goostree 06/22/2017, 6:52 PM

## 2017-06-23 ENCOUNTER — Inpatient Hospital Stay (HOSPITAL_COMMUNITY): Payer: Medicaid Other | Admitting: Anesthesiology

## 2017-06-23 ENCOUNTER — Encounter (HOSPITAL_COMMUNITY): Payer: Self-pay

## 2017-06-23 LAB — CBC
HEMATOCRIT: 31.6 % — AB (ref 36.0–46.0)
Hemoglobin: 10.4 g/dL — ABNORMAL LOW (ref 12.0–15.0)
MCH: 29.7 pg (ref 26.0–34.0)
MCHC: 32.9 g/dL (ref 30.0–36.0)
MCV: 90.3 fL (ref 78.0–100.0)
Platelets: 363 10*3/uL (ref 150–400)
RBC: 3.5 MIL/uL — AB (ref 3.87–5.11)
RDW: 14.2 % (ref 11.5–15.5)
WBC: 20.6 10*3/uL — AB (ref 4.0–10.5)

## 2017-06-23 LAB — RPR: RPR Ser Ql: NONREACTIVE

## 2017-06-23 MED ORDER — VALACYCLOVIR HCL 500 MG PO TABS
1000.0000 mg | ORAL_TABLET | Freq: Every day | ORAL | Status: DC
  Filled 2017-06-23: qty 2

## 2017-06-23 MED ORDER — ONDANSETRON HCL 4 MG/2ML IJ SOLN: 4.0000 mg | INTRAMUSCULAR | Status: DC | PRN

## 2017-06-23 MED ORDER — FENTANYL 2.5 MCG/ML BUPIVACAINE 1/10 % EPIDURAL INFUSION (WH - ANES)
14.0000 mL/h | INTRAMUSCULAR | Status: DC | PRN
  Administered 2017-06-23: 14 mL/h via EPIDURAL

## 2017-06-23 MED ORDER — PHENYLEPHRINE 40 MCG/ML (10ML) SYRINGE FOR IV PUSH (FOR BLOOD PRESSURE SUPPORT)
80.0000 ug | PREFILLED_SYRINGE | INTRAVENOUS | Status: DC | PRN
  Filled 2017-06-23: qty 5

## 2017-06-23 MED ORDER — DIPHENHYDRAMINE HCL 50 MG/ML IJ SOLN: 12.5000 mg | INTRAMUSCULAR | Status: DC | PRN

## 2017-06-23 MED ORDER — LEVOTHYROXINE SODIUM 125 MCG PO TABS
125.0000 ug | ORAL_TABLET | Freq: Every day | ORAL | Status: DC
  Filled 2017-06-23 (×2): qty 1

## 2017-06-23 MED ORDER — ACETAMINOPHEN 325 MG PO TABS: 650.0000 mg | ORAL_TABLET | ORAL | Status: DC | PRN

## 2017-06-23 MED ORDER — LAMOTRIGINE 150 MG PO TABS
150.0000 mg | ORAL_TABLET | Freq: Two times a day (BID) | ORAL | Status: DC
  Administered 2017-06-23: 150 mg via ORAL
  Filled 2017-06-23 (×3): qty 1

## 2017-06-23 MED ORDER — IBUPROFEN 600 MG PO TABS
600.0000 mg | ORAL_TABLET | Freq: Four times a day (QID) | ORAL | Status: DC
  Administered 2017-06-23 (×2): 600 mg via ORAL
  Filled 2017-06-23 (×2): qty 1

## 2017-06-23 MED ORDER — PRENATAL MULTIVITAMIN CH
1.0000 | ORAL_TABLET | Freq: Every day | ORAL | Status: DC
  Administered 2017-06-23: 1 via ORAL
  Filled 2017-06-23: qty 1

## 2017-06-23 MED ORDER — LIDOCAINE HCL (PF) 1 % IJ SOLN
INTRAMUSCULAR | Status: DC | PRN
Start: 2017-06-23 — End: 2017-06-25
  Administered 2017-06-23: 4 mL via EPIDURAL

## 2017-06-23 MED ORDER — TETANUS-DIPHTH-ACELL PERTUSSIS 5-2.5-18.5 LF-MCG/0.5 IM SUSP: 0.5000 mL | Freq: Once | INTRAMUSCULAR | Status: DC

## 2017-06-23 MED ORDER — ZOLPIDEM TARTRATE 5 MG PO TABS: 5.0000 mg | ORAL_TABLET | Freq: Every evening | ORAL | Status: DC | PRN

## 2017-06-23 MED ORDER — IBUPROFEN 600 MG PO TABS: 600.0000 mg | ORAL_TABLET | Freq: Four times a day (QID) | ORAL | 2 refills | Status: DC | PRN

## 2017-06-23 MED ORDER — LACTATED RINGERS IV SOLN
500.0000 mL | Freq: Once | INTRAVENOUS | Status: AC
  Administered 2017-06-23: 500 mL via INTRAVENOUS

## 2017-06-23 MED ORDER — COCONUT OIL OIL: 1.0000 "application " | TOPICAL_OIL | Status: DC | PRN

## 2017-06-23 MED ORDER — WITCH HAZEL-GLYCERIN EX PADS: 1.0000 "application " | MEDICATED_PAD | CUTANEOUS | Status: DC | PRN

## 2017-06-23 MED ORDER — PHENYLEPHRINE 40 MCG/ML (10ML) SYRINGE FOR IV PUSH (FOR BLOOD PRESSURE SUPPORT)
PREFILLED_SYRINGE | INTRAVENOUS | Status: AC
  Filled 2017-06-23: qty 10

## 2017-06-23 MED ORDER — DIPHENHYDRAMINE HCL 25 MG PO CAPS: 25.0000 mg | ORAL_CAPSULE | Freq: Four times a day (QID) | ORAL | Status: DC | PRN

## 2017-06-23 MED ORDER — LACTATED RINGERS IV SOLN: 500.0000 mL | Freq: Once | INTRAVENOUS | Status: DC

## 2017-06-23 MED ORDER — SIMETHICONE 80 MG PO CHEW: 80.0000 mg | CHEWABLE_TABLET | ORAL | Status: DC | PRN

## 2017-06-23 MED ORDER — SENNOSIDES-DOCUSATE SODIUM 8.6-50 MG PO TABS: 2.0000 | ORAL_TABLET | ORAL | Status: DC

## 2017-06-23 MED ORDER — EPHEDRINE 5 MG/ML INJ
10.0000 mg | INTRAVENOUS | Status: DC | PRN
  Filled 2017-06-23: qty 2

## 2017-06-23 MED ORDER — DIBUCAINE 1 % RE OINT: 1.0000 "application " | TOPICAL_OINTMENT | RECTAL | Status: DC | PRN

## 2017-06-23 MED ORDER — BENZOCAINE-MENTHOL 20-0.5 % EX AERO: 1.0000 "application " | INHALATION_SPRAY | CUTANEOUS | Status: DC | PRN

## 2017-06-23 MED ORDER — FENTANYL 2.5 MCG/ML BUPIVACAINE 1/10 % EPIDURAL INFUSION (WH - ANES)
INTRAMUSCULAR | Status: AC
  Filled 2017-06-23: qty 100

## 2017-06-23 MED ORDER — ONDANSETRON HCL 4 MG PO TABS: 4.0000 mg | ORAL_TABLET | ORAL | Status: DC | PRN

## 2017-06-23 NOTE — Progress Notes (Signed)
CBC came back with a Hgb of 10.4. Per orders okay to D/C. Royston CowperIsley, Waylynn Benefiel E, RN

## 2017-06-23 NOTE — Lactation Note (Signed)
This note was copied from a baby's chart. Lactation Consultation Note  Patient Name: Boy Cambryn Charters [id number] Date: 06/23/2017 Reason for consult: Initial assessment;NICU baby(baby transported to [facility]/cardiac.)  NICU baby 85 hours old, transferred to [facility] for cardiac issues. Mom reports that her milk came to volume with first child, and she became engorged. Offered to set mom up with DEBP, but mom reports she has already been discharged and would like to have [place] and pumping kit to start pumping at home. Reviewed how set up and use pumping kit, and discussed initiation and maintenance settings on pump. Mom reports that she pumped with first child. Enc mom to pump every 2-3 hours followed by hand expression--mom reports that she is comfortable with hand expression. Mom given NICU booklet and [place] brochure with review and enc to call for assistance as needed. Enc mom to check with [facility] to see about special instructions for collection and labeling of EBM and whether she can use hospital pump with her pumping kit--and not have to carry pump to the hospital. Mom reports no additional questions at this time.   Maternal Data Has patient been taught Hand Expression?: Yes(per mom.)  Feeding    LATCH Score                   Interventions Interventions: DEBP  Lactation Tools Discussed/Used WIC Program: Yes Pump Review: Setup, frequency, and cleaning;Milk Storage   Consult Status Consult Status: PRN    Andres Labrum 06/23/2017, 2:10 PM

## 2017-06-23 NOTE — Progress Notes (Signed)
Post Partum Day 0 Subjective: no complaints, up ad lib, voiding and tolerating PO; fatigued. Baby in NICU  Objective: Blood pressure 103/61, pulse 81, temperature 98.7 F (37.1 C), temperature source Oral, resp. rate 18, height 5\' 7"  (1.702 m), weight 154 lb (69.9 kg), last menstrual period 11/08/2016, SpO2 99 %, unknown if currently breastfeeding.  Physical Exam:  General: alert, cooperative and no distress Lochia: appropriate Uterine Fundus: firm Incision: n/a DVT Evaluation: No evidence of DVT seen on physical exam.  Recent Labs    06/22/17 1859  HGB 10.5*  HCT 32.1*    Assessment/Plan: Routine pp care   LOS: 1 day   Monica Patel 06/23/2017, 8:49 AM

## 2017-06-23 NOTE — Anesthesia Preprocedure Evaluation (Signed)
Anesthesia Evaluation  Patient identified by MRN, date of birth, ID band Patient awake    Reviewed: Allergy & Precautions, Patient's Chart, lab work & pertinent test results  Airway Mallampati: I       Dental no notable dental hx.    Pulmonary Current Smoker,    Pulmonary exam normal        Cardiovascular negative cardio ROS Normal cardiovascular exam     Neuro/Psych PSYCHIATRIC DISORDERS Anxiety Depression Bipolar Disorder Schizophrenia    GI/Hepatic negative GI ROS, Neg liver ROS,   Endo/Other  Hypothyroidism   Renal/GU      Musculoskeletal   Abdominal   Peds  Hematology   Anesthesia Other Findings   Reproductive/Obstetrics (+) Pregnancy                             Anesthesia Physical Anesthesia Plan  ASA: II  Anesthesia Plan: Epidural   Post-op Pain Management:    Induction:   PONV Risk Score and Plan:   Airway Management Planned:   Additional Equipment:   Intra-op Plan:   Post-operative Plan:   Informed Consent: I have reviewed the patients History and Physical, chart, labs and discussed the procedure including the risks, benefits and alternatives for the proposed anesthesia with the patient or authorized representative who has indicated his/her understanding and acceptance.     Plan Discussed with:   Anesthesia Plan Comments: (Lab Results             PLT                      409 (H)             06/22/2017           )        Anesthesia Quick Evaluation

## 2017-06-23 NOTE — Progress Notes (Signed)
CSW acknowledges consult and completed clinical assessment.  Clinical documentation will follow.  There are no barriers to d/c.  Avie Checo Boyd-Gilyard, MSW, LCSW Clinical Social Work [phone]   

## 2017-06-23 NOTE — Discharge Instructions (Signed)
Nothing in vagina for 6 weeks.  No sex, tampons, and douching.  Other instructions as in [facility] Discharge Booklet. °

## 2017-06-23 NOTE — Anesthesia Postprocedure Evaluation (Signed)
Anesthesia Post Note  Patient: Lisette Grindermily C Mitch  Procedure(s) Performed: AN AD HOC LABOR EPIDURAL     Patient location during evaluation: Mother Baby Anesthesia Type: Epidural Level of consciousness: awake, awake and alert, oriented and patient cooperative Pain management: pain level controlled Vital Signs Assessment: post-procedure vital signs reviewed and stable Respiratory status: spontaneous breathing, nonlabored ventilation and respiratory function stable Cardiovascular status: stable Postop Assessment: no headache, no backache, patient able to bend at knees and no apparent nausea or vomiting Anesthetic complications: no    Last Vitals:  Vitals:   06/23/17 0745 06/23/17 0830  BP:  103/61  Pulse:  81  Resp: 20 18  Temp:  37.1 C  SpO2:      Last Pain:  Vitals:   06/23/17 1311  TempSrc:   PainSc: 4    Pain Goal:                 Aniah Pauli L

## 2017-06-23 NOTE — Anesthesia Procedure Notes (Signed)
Epidural Patient location during procedure: OB Start time: 06/23/2017 4:23 AM End time: 06/23/2017 4:29 AM  Staffing Anesthesiologist: Shelton SilvasHollis, Shuronda Santino D, MD Performed: anesthesiologist   Preanesthetic Checklist Completed: patient identified, site marked, surgical consent, pre-op evaluation, timeout performed, IV checked, risks and benefits discussed and monitors and equipment checked  Epidural Patient position: sitting Prep: ChloraPrep Patient monitoring: heart rate, continuous pulse ox and blood pressure Approach: midline Location: L3-L4 Injection technique: LOR saline  Needle:  Needle type: Tuohy  Needle gauge: 17 G Needle length: 9 cm Catheter type: closed end flexible Catheter size: 20 Guage Test dose: negative and 1.5% lidocaine  Assessment Events: blood not aspirated, injection not painful, no injection resistance and no paresthesia  Additional Notes LOR @ 4  Patient identified. Risks/Benefits/Options discussed with patient including but not limited to bleeding, infection, nerve damage, paralysis, failed block, incomplete pain control, headache, blood pressure changes, nausea, vomiting, reactions to medications, itching and postpartum back pain. Confirmed with bedside nurse the patient's most recent platelet count. Confirmed with patient that they are not currently taking any anticoagulation, have any bleeding history or any family history of bleeding disorders. Patient expressed understanding and wished to proceed. All questions were answered. Sterile technique was used throughout the entire procedure. Please see nursing notes for vital signs. Test dose was given through epidural catheter and negative prior to continuing to dose epidural or start infusion. Warning signs of high block given to the patient including shortness of breath, tingling/numbness in hands, complete motor block, or any concerning symptoms with instructions to call for help. Patient was given instructions  on fall risk and not to get out of bed. All questions and concerns addressed with instructions to call with any issues or inadequate analgesia.    Reason for block:procedure for pain

## 2017-06-23 NOTE — Progress Notes (Addendum)
Patient ID: Monica Patel, female   DOB: 1990/11/05, 26 y.o.   MRN: [id number] Pt doing well with no complaints. States pain well controlled with ibuprofen, no fever, chills, CP or SOB. Lochia moderate. 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

## 2017-06-23 NOTE — Discharge Summary (Signed)
OB Discharge Summary     Patient Name: Monica Patel DOB: [birth year] MRN: [id number]  Date of admission: 06/22/2017 Delivering MD: Huel CoteICHARDSON, KATHY   Date of discharge: 06/23/2017  Admitting diagnosis: 36WKS WATER BROKE Intrauterine pregnancy: 777w4d     Secondary diagnosis:  Active Problems:   Premature rupture of membranes (PROM) affecting third pregnancy   NSVD (normal spontaneous vaginal delivery)  Additional problems: none     Discharge diagnosis: Preterm Pregnancy Delivered                                                                                                Post partum procedures:none  Augmentation: Pitocin  Complications: None  Hospital course:  Onset of Labor With Vaginal Delivery     26 y.o. yo [id number] at 747w4d was admitted in Latent Labor on 06/22/2017. Patient had an uncomplicated labor course as follows:  Membrane Rupture Time/Date: 5:30 PM ,06/22/2017   Intrapartum Procedures: Episiotomy: None [1]                                         Lacerations:  None [1]  Patient had a delivery of a Viable infant. 06/23/2017  Information for the patient's newborn:  Heywood BeneGrady, Boy Jadae [[id number]][[id number]]       Pateint had an uncomplicated postpartum course.  She is ambulating, tolerating a regular diet, passing flatus, and urinating well. 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 Negative Homan's  sign. Labs: Lab Results  Component Value Date   WBC 13.8 (H) 06/22/2017   HGB 10.5 (L) 06/22/2017   HCT 32.1 (L) 06/22/2017   MCV 89.9 06/22/2017   PLT 409 (H) 06/22/2017   CMP Latest Ref Rng & Units 06/02/2017  Glucose 65 - 99 mg/dL 71  BUN 6 - 20 mg/dL 9  Creatinine 6.570.44 - 8.461.00 mg/dL 9.620.65  Sodium 952135 - 841145 mmol/L 136  Potassium 3.5 - 5.1 mmol/L 4.0  Chloride 101 - 111 mmol/L 102  CO2 22 - 32 mmol/L 21(L)  Calcium 8.9 - 10.3 mg/dL 9.0  Total Protein 6.5 - 8.1 g/dL 6.5  Total Bilirubin 0.3 - 1.2 mg/dL 1.0  Alkaline Phos 38 - 126 U/L 154(H)  AST 15 - 41 U/L 17  ALT 14 - 54 U/L 12(L)    Discharge instruction: per After Visit Summary and "Baby  and Me Booklet".  After visit meds:  Allergies as of 06/23/2017      Reactions   Nickel Hives, Rash      Medication List    STOP taking these medications   DICLEGIS 10-10 MG Tbec Generic drug:  Doxylamine-Pyridoxine   promethazine 25 MG tablet Commonly known as:  PHENERGAN     TAKE these medications   acetaminophen 325 MG tablet Commonly known as:  TYLENOL Take 650 mg by mouth every 6 (six) hours as needed for moderate pain.   ibuprofen 600 MG tablet Commonly known as:  ADVIL,MOTRIN Take 1 tablet (600 mg total) by mouth every 6 (six) hours as needed.   lamoTRIgine 150 MG tablet Commonly known as:  LAMICTAL Take 150 mg by mouth 2 (two) times daily.   levothyroxine 125 MCG tablet Commonly known as:  SYNTHROID, LEVOTHROID Take 125 mcg by mouth daily before breakfast.   LORazepam 0.5 MG tablet Commonly known as:  ATIVAN Take 0.5 mg by mouth at bedtime as needed for anxiety or sleep.   Prenatal Vitamins 0.8 MG tablet Take 1 tablet by mouth daily.   valACYclovir 1000 MG tablet Commonly known as:  VALTREX Take 1,000 mg by mouth daily as needed (for outbreak).       Diet: routine diet  Activity: Advance as tolerated. Pelvic rest for 6 weeks.   Outpatient follow up:6 weeks Follow up Appt:No future  appointments. Follow up Visit:No Follow-up on file.  Postpartum contraception: Not Discussed  Newborn Data: Live born female  Birth Weight: 5 lb 8.5 oz (2510 g) APGAR: 3,   Newborn Delivery   Birth date/time:  06/23/2017 05:21:00 Delivery type:  Vaginal, Spontaneous     Baby Feeding: Breast [facility]   06/23/2017 Cathrine Musterecilia W Courtenay Creger, DO

## 2017-06-24 NOTE — Clinical Social Work Maternal (Signed)
CLINICAL SOCIAL WORK MATERNAL/CHILD NOTE  Patient Details  Name: Monica Patel MRN: [id number] Date of Birth: August 09, 1990  Date:  06/24/2017  Clinical Social Worker Initiating Note:  Laurey Arrow Date/Time: Initiated:  06/23/17/1409     Child's Name:  Monica Patel   Biological Parents:  Mother, Father   Need for Interpreter:      Reason for Referral:  Parental Support of Premature Babies < 62 weeks/or Critically Ill babies, Behavioral Health Concerns   Address:  [address] [address]    Phone number:  [phone] (home)     Additional phone number:   Household Members/Support Persons (HM/SP):   Household Member/Support Person 1   HM/SP Name Relationship DOB or Age  HM/SP -Crozier FOB 10/15/1984  HM/SP -2        HM/SP -3        HM/SP -4        HM/SP -5        HM/SP -6        HM/SP -7        HM/SP -8          Natural Supports (not living in the home):  Extended Family, Immediate Family, Parent(FOB's family will also provide support. )   Professional Supports: Therapist, Case Manager/Social Worker(MOB is an established patient at [facility].)   Employment: Unemployed   Type of Work:     Education:  Orient arranged:    Museum/gallery curator Resources:      Other Resources:      Cultural/Religious Considerations Which May Impact Care:  Per MOB's Face Sheet, MOB is baptist.   Strengths:  Ability to meet basic needs , Compliance with medical plan , Understanding of illness, Psychotropic Medications, Home prepared for child    Psychotropic Medications:  Latuda, Lamictal, Other meds(Ativan)      Pediatrician:       Pediatrician List:   [place]      Pediatrician Fax Number:    Risk Factors/Current Problems:  Mental Health Concerns    Cognitive State:  Alert , Able to Concentrate , Linear Thinking , Insightful     Mood/Affect:  Relaxed , Calm , Interested    CSW Assessment: CSW met with MOB to complete an assessment for NICU admission and MH hx.  When CSW arrived, MOB had a room full of guest, and respectively everyone left but FOB/Fiance [name]).  MOB gave CSW permission to speak with MOB while FOB was present.  Both parents were inviting, polite, and supportive of one another during the assessment.  CSW inquired about their feelings regarding NICU admission and rapid transfer to [place] stated "This is not what I expected, but I am holding it together.  I just want to be discharged so we can head down to [facility]." FOB is tearful and disclosed feelings of being afraid and scared when he was escorted to the NICU unit. CSW validated and normalized their thoughts and feelings and provided education regarding the baby blues period vs. perinatal mood disorders, discussed treatment and gave resources for mental health follow up if concerns arise.  CSW recommends self-evaluation during the postpartum time period using the New Mom Checklist from Postpartum Progress and encouraged MOB to contact a medical professional if symptoms are noted at any time. MOB openly  shared MOB's MH hx and communicated that MOB is an established patient at [facility].  CSW was able to process other therapeutic interventions (deep breathing, meditation, and journal writing) that the family could utilize to assist with coping while infant remains in NICU; MOB and FOB were receptive.   CSW identifies no further need for intervention and no barriers to discharge at this time. OB denied any MH and SA hx.  CSW inquired about barriers, needs, and concerns and both parents denied them.  MOB communicated that they have everything they need for the twins.  MOB became tearful as MOB spoke about discharging from the hospital and having to leave the twins in the NICU.  CSW normalized and validated MOB's thoughts and feelings.  CSW  reviewed NICU visitation policy and encouraged MOB to visit as often as she likes.  CSW will continue to assess for psychosocial stressors while twins are in NICU.  Please contact the Clinical Social Worker if specific needs arise, or requested from MOB.  CSW provided MOB with information to add infant on to [employer], Medicaid, and [facility] application.  CSW also discussed that baby did qualify for SSI due to birth anomalies.  MOB signed Request of Access document and was given copy of admission summary in effort to apply for SSI once SSN has been received.   CSW Plan/Description:  No Further Intervention Required/No Barriers to Discharge, Perinatal Mood and Anxiety Disorder (PMADs) Education, Other Patient/Family Education, [employer] Income (SSI) Information   Laurey Arrow, MSW, LCSW Clinical Social Work [phone]  Dimple Nanas, LCSW 06/24/2017, 10:14 AM

## 2019-02-24 ENCOUNTER — Other Ambulatory Visit (HOSPITAL_COMMUNITY): Payer: Self-pay | Admitting: Psychiatry

## 2019-05-09 LAB — OB RESULTS CONSOLE HIV ANTIBODY (ROUTINE TESTING): HIV: NONREACTIVE

## 2019-05-09 LAB — OB RESULTS CONSOLE ANTIBODY SCREEN
Antibody Screen: NEGATIVE
Antibody Screen: POSITIVE

## 2019-05-09 LAB — OB RESULTS CONSOLE RPR: RPR: NONREACTIVE

## 2019-05-09 LAB — OB RESULTS CONSOLE GC/CHLAMYDIA
Chlamydia: NEGATIVE
Gonorrhea: NEGATIVE

## 2019-05-09 LAB — OB RESULTS CONSOLE ABO/RH: RH Type: POSITIVE

## 2019-05-09 LAB — OB RESULTS CONSOLE RUBELLA ANTIBODY, IGM: Rubella: IMMUNE

## 2019-05-09 LAB — OB RESULTS CONSOLE HEPATITIS B SURFACE ANTIGEN: Hepatitis B Surface Ag: NEGATIVE

## 2019-07-28 NOTE — L&D Delivery Note (Signed)
Delivery Note Pt progressed to C/C/+3, pushed w 1 contraction for delivery.  At 3:05 PM a viable and healthy female was delivered via Vaginal, Spontaneous (Presentation: Right Occiput Anterior).  APGAR: 9, 9; weight  P.   Placenta status: Spontaneous, Intact.  Cord: 2 vessels with the following complications: Nuchal x 2.    Anesthesia: Epidural Episiotomy: None Lacerations: Periurethral Suture Repair: 3.0 vicryl rapide Est. Blood Loss (mL): 133cc  Mom to postpartum.  Baby to Couplet care / Skin to Skin.  Ashliegh Parekh Bovard-Stuckert 10/25/2019, 3:30 PM  O+/RI/Tdap in PNC/RI/Contra ?

## 2019-09-21 ENCOUNTER — Other Ambulatory Visit (HOSPITAL_COMMUNITY): Payer: Self-pay | Admitting: Obstetrics and Gynecology

## 2019-09-21 DIAGNOSIS — O283 Abnormal ultrasonic finding on antenatal screening of mother: Secondary | ICD-10-CM

## 2019-09-21 DIAGNOSIS — Z3689 Encounter for other specified antenatal screening: Secondary | ICD-10-CM

## 2019-09-21 DIAGNOSIS — Z3A34 34 weeks gestation of pregnancy: Secondary | ICD-10-CM

## 2019-09-25 ENCOUNTER — Encounter (HOSPITAL_COMMUNITY): Payer: Self-pay | Admitting: *Deleted

## 2019-09-27 ENCOUNTER — Ambulatory Visit (HOSPITAL_COMMUNITY)
Admission: RE | Admit: 2019-09-27 | Discharge: 2019-09-27 | Disposition: A | Discharge status: Home / Self Care | Payer: Medicaid Other | Source: Ambulatory Visit | Attending: Obstetrics and Gynecology | Admitting: Obstetrics and Gynecology

## 2019-09-27 ENCOUNTER — Encounter (HOSPITAL_COMMUNITY): Payer: Self-pay

## 2019-09-27 ENCOUNTER — Other Ambulatory Visit (HOSPITAL_COMMUNITY): Payer: Self-pay | Admitting: *Deleted

## 2019-09-27 ENCOUNTER — Ambulatory Visit (HOSPITAL_COMMUNITY): Payer: Medicaid Other | Admitting: Genetic Counselor

## 2019-09-27 ENCOUNTER — Other Ambulatory Visit: Payer: Self-pay

## 2019-09-27 ENCOUNTER — Ambulatory Visit (HOSPITAL_COMMUNITY): Payer: Medicaid Other | Admitting: *Deleted

## 2019-09-27 VITALS — BP 106/68 | HR 102 | Temp 98.0°F | Ht 66.0 in

## 2019-09-27 DIAGNOSIS — Z3689 Encounter for other specified antenatal screening: Secondary | ICD-10-CM | POA: Diagnosis present

## 2019-09-27 DIAGNOSIS — O99343 Other mental disorders complicating pregnancy, third trimester: Secondary | ICD-10-CM | POA: Insufficient documentation

## 2019-09-27 DIAGNOSIS — O0990 Supervision of high risk pregnancy, unspecified, unspecified trimester: Secondary | ICD-10-CM

## 2019-09-27 DIAGNOSIS — O359XX0 Maternal care for (suspected) fetal abnormality and damage, unspecified, not applicable or unspecified: Secondary | ICD-10-CM | POA: Diagnosis not present

## 2019-09-27 DIAGNOSIS — O09213 Supervision of pregnancy with history of pre-term labor, third trimester: Secondary | ICD-10-CM | POA: Insufficient documentation

## 2019-09-27 DIAGNOSIS — O283 Abnormal ultrasonic finding on antenatal screening of mother: Secondary | ICD-10-CM

## 2019-09-27 DIAGNOSIS — Z3A34 34 weeks gestation of pregnancy: Secondary | ICD-10-CM | POA: Insufficient documentation

## 2019-09-27 DIAGNOSIS — O99333 Smoking (tobacco) complicating pregnancy, third trimester: Secondary | ICD-10-CM

## 2019-09-27 DIAGNOSIS — E039 Hypothyroidism, unspecified: Secondary | ICD-10-CM | POA: Insufficient documentation

## 2019-09-27 DIAGNOSIS — O09293 Supervision of pregnancy with other poor reproductive or obstetric history, third trimester: Secondary | ICD-10-CM | POA: Diagnosis not present

## 2019-09-27 DIAGNOSIS — Z363 Encounter for antenatal screening for malformations: Secondary | ICD-10-CM | POA: Diagnosis not present

## 2019-09-27 DIAGNOSIS — O99283 Endocrine, nutritional and metabolic diseases complicating pregnancy, third trimester: Secondary | ICD-10-CM | POA: Insufficient documentation

## 2019-09-27 HISTORY — DX: Other psychoactive substance abuse, uncomplicated: F19.10

## 2019-10-03 LAB — OB RESULTS CONSOLE GBS: GBS: NEGATIVE

## 2019-10-04 ENCOUNTER — Other Ambulatory Visit (HOSPITAL_COMMUNITY): Payer: Self-pay | Admitting: Obstetrics and Gynecology

## 2019-10-04 ENCOUNTER — Other Ambulatory Visit (HOSPITAL_COMMUNITY): Payer: Self-pay | Admitting: Gynecology

## 2019-10-04 NOTE — Progress Notes (Signed)
I briefly met with Ms. Voigt to discuss the significance of her failed NIPS results in relation to identified ultrasound findings. We reviewed that Ms. Wisor [facility] for the presence of possible "double bubble" sign on ultrasound. Double bubble sign represents fluid and air that gets trapped in the fetal stomach/duodenum rather than moving through the gastrointestinal tract to the intestines. It is associated with trisomy 21 AKA Down syndrome in approximately 20-30% of cases, as infants with Down syndrome can have narrowing or blockage of the bowel (duodenal atresia/stenosis) that appears as the double bubble sign on fetal ultrasound. Double bubble sign was not appreciated on today's ultrasound; however, the fetus's bowel did appear to be dilated.  Ms. Cedotal had screening for chromosomal aneuploidies such as trisomy 6 via [facility] noninvasive prenatal screening (NIPS) earlier in her pregnancy. Unfortunately, Ms. Ruedas submitted samples at two different time points, both of which failed to yield any results. Per a Dietitian at the [facility], there was question of sample contamination with Ms. Stehr first sample. Her second sample yielded an imperceptible amount of DNA, so the sample could not be analyzed. Since NIPS failed, a revised risk estimate for chromosomal aneuploidies in the current pregnancy was not able to be calculated. Based on her age and in the third trimester, Ms. Galanti has a less than 1 in 385 chance of having a baby with a chromosomal aneuploidy, and a less than 1 in 65 chance of having a baby with Down syndrome specifically. However, fetal chromosomal aneuploidies or other genetic conditions cannot be ruled out without diagnostic testing via amniocentesis. It is also not clear what, if anything could be causing fetal bowel dilation identified on ultrasound.  Given that Ms. Kouba's pregnancy has completed [redacted]w[redacted]d she was comfortable  waiting until the postnatal period to pursue any genetic testing if indicated. I provided her with my business card and encouraged her to contact me if she had any questions or concerns at any time.   I counseled Ms. GSchweersregarding the above information. The approximate face-to-face time with the genetic counselor was 15 minutes.  HBuelah Manis MS, [facility]tr[facility]etic Counselor

## 2019-10-19 ENCOUNTER — Ambulatory Visit (HOSPITAL_COMMUNITY)
Admission: RE | Admit: 2019-10-19 | Discharge: 2019-10-19 | Disposition: A | Discharge status: Home / Self Care | Payer: Medicaid Other | Source: Ambulatory Visit | Attending: Obstetrics and Gynecology | Admitting: Obstetrics and Gynecology

## 2019-10-19 ENCOUNTER — Other Ambulatory Visit: Payer: Self-pay

## 2019-10-19 ENCOUNTER — Other Ambulatory Visit (HOSPITAL_COMMUNITY): Payer: Self-pay | Admitting: Obstetrics and Gynecology

## 2019-10-19 ENCOUNTER — Ambulatory Visit (HOSPITAL_COMMUNITY): Payer: Medicaid Other | Admitting: *Deleted

## 2019-10-19 ENCOUNTER — Encounter (HOSPITAL_COMMUNITY): Payer: Self-pay

## 2019-10-19 VITALS — BP 108/67 | HR 86 | Temp 97.5°F

## 2019-10-19 DIAGNOSIS — Z362 Encounter for other antenatal screening follow-up: Secondary | ICD-10-CM | POA: Diagnosis not present

## 2019-10-19 DIAGNOSIS — O09213 Supervision of pregnancy with history of pre-term labor, third trimester: Secondary | ICD-10-CM

## 2019-10-19 DIAGNOSIS — O289 Unspecified abnormal findings on antenatal screening of mother: Secondary | ICD-10-CM | POA: Diagnosis not present

## 2019-10-19 DIAGNOSIS — Z3A38 38 weeks gestation of pregnancy: Secondary | ICD-10-CM

## 2019-10-19 DIAGNOSIS — O359XX0 Maternal care for (suspected) fetal abnormality and damage, unspecified, not applicable or unspecified: Secondary | ICD-10-CM

## 2019-10-19 DIAGNOSIS — E039 Hypothyroidism, unspecified: Secondary | ICD-10-CM

## 2019-10-19 DIAGNOSIS — O0990 Supervision of high risk pregnancy, unspecified, unspecified trimester: Secondary | ICD-10-CM

## 2019-10-19 DIAGNOSIS — O99333 Smoking (tobacco) complicating pregnancy, third trimester: Secondary | ICD-10-CM

## 2019-10-19 DIAGNOSIS — O99283 Endocrine, nutritional and metabolic diseases complicating pregnancy, third trimester: Secondary | ICD-10-CM

## 2019-10-19 DIAGNOSIS — O09293 Supervision of pregnancy with other poor reproductive or obstetric history, third trimester: Secondary | ICD-10-CM

## 2019-10-20 ENCOUNTER — Telehealth (HOSPITAL_COMMUNITY): Payer: Self-pay | Admitting: *Deleted

## 2019-10-20 ENCOUNTER — Encounter (HOSPITAL_COMMUNITY): Payer: Self-pay | Admitting: *Deleted

## 2019-10-20 NOTE — Telephone Encounter (Signed)
Preadmission screen Will only be 38 7/6 on day of induction.  Message left at office to reschedule the induction

## 2019-10-20 NOTE — Telephone Encounter (Signed)
Preadmission screen  

## 2019-10-23 ENCOUNTER — Other Ambulatory Visit (HOSPITAL_COMMUNITY): Payer: Self-pay | Admitting: *Deleted

## 2019-10-23 ENCOUNTER — Other Ambulatory Visit (HOSPITAL_COMMUNITY)
Admission: RE | Admit: 2019-10-23 | Discharge: 2019-10-23 | Disposition: A | Discharge status: Home / Self Care | Payer: Medicaid Other | Source: Ambulatory Visit | Attending: Obstetrics and Gynecology | Admitting: Obstetrics and Gynecology

## 2019-10-23 DIAGNOSIS — Z01812 Encounter for preprocedural laboratory examination: Secondary | ICD-10-CM | POA: Insufficient documentation

## 2019-10-23 DIAGNOSIS — Z20822 Contact with and (suspected) exposure to covid-19: Secondary | ICD-10-CM | POA: Insufficient documentation

## 2019-10-23 LAB — SARS CORONAVIRUS 2 (TAT 6-24 HRS): SARS Coronavirus 2: NEGATIVE

## 2019-10-24 ENCOUNTER — Other Ambulatory Visit: Payer: Self-pay | Admitting: Obstetrics and Gynecology

## 2019-10-24 NOTE — H&P (Deleted)
  The note originally documented on this encounter has been moved the the encounter in which it belongs.  

## 2019-10-24 NOTE — H&P (Signed)
Monica Patel is a 29 y.o. female [phone] at 22+ with IUGR for IOL.  Pregnancy dated by early [place].  Pt with h/o preterm delivery (given Makena)- son w TAPVR, pt with celiac, h/o hypothyroidism, maternal mental d/o - PTSD, bipolar, ? Schizophrenia, depression, anxiety, also h/o matternal herpes - 1-2 outbreaks per year, abnormal US findings - ? 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  Low-lyig placenta resolved.  No result x 2 with NIPT - MFM referral  OB History    Gravida  4   Para  2   Term  1   Preterm  1   AB  1   Living  2     SAB  1   TAB  0   Ectopic  0   Multiple  0   Live Births  2         G1 39wk 8#5 SVD female G2 SAB G3 36wk 5#8 SVD female G4 present  + abn pap, last 2018 ASCUS HR HPV neg +Chl, herpes  Past Medical History:  Diagnosis Date  . Abnormal Pap smear    colpo  . 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 Anemia   . Anxiety   . Bipolar 1 disorder ([place])   . Celiac disease   . Depression   . Genital herpes   . Infection    UTI  . Mania ([place])   . Polysubstance abuse ([place])   . PTSD (post-traumatic stress disorder)   . Schizo affective schizophrenia ([place])   . Scoliosis   . Thyroid disease   . Vaginal Pap smear, abnormal    Past Surgical History:  Procedure Laterality Date  . COLPOSCOPY    . DILATION AND EVACUATION  06/04/2012   Procedure: DILATATION AND EVACUATION;  Surgeon: Melina Schools, MD;  Location: [facility];  Service: Gynecology;  Laterality: N/A;   Family History: family history includes Anxiety disorder in her father, sister, and sister; Birth defects in her son; Depression in her father, mother, and sister; OCD in her sister. HTN, ov CA, Breast cancer, hypothyroid Social History:  reports that she has been smoking cigarettes. She has a 3.00 pack-year smoking history. She has never used smokeless tobacco. She reports current alcohol use. She reports current drug use. 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; CNA, single - long term relationship  Meds Atarax, lamotrigine, levothyroxine, Makena, Valtrex All Nickel, NKDA     Maternal Diabetes: No Genetic Screening: Abnormal:  Results: Other:no result x 2 - NIPT Maternal Ultrasounds/Referrals: IUGR and Other:dilated bowels, persistent umbilical vein Fetal Ultrasounds or other Referrals:  Referred to Materal Fetal Medicine  Maternal Substance Abuse:  Yes:  Type: Smoker, Marijuana, Cocaine, Prescription drugs, Methadone, Other: sober x 4 years Significant Maternal Medications:  Meds include: Other: Makena Significant Maternal Lab Results:  Group B Strep negative Other Comments:  Hypothyroidism, increase levothyroxine w pregnancy still elevated  Review of Systems  Constitutional: Negative.   HENT: Negative.   Eyes: Negative.   Cardiovascular: Negative.   Gastrointestinal: Negative.   Genitourinary: Negative.   Musculoskeletal: Positive for back pain.  Skin: Negative.   Neurological: Negative.   Psychiatric/Behavioral: Negative.    Maternal Medical History:  Contractions: Frequency: irregular.    Fetal activity: Perceived fetal activity is normal.    Prenatal complications: IUGR.   Dilated large intestine  Prenatal Complications - Diabetes: none.      unknown if currently breastfeeding. Maternal  Exam:  Uterine Assessment: Contraction frequency is irregular.   Abdomen: Patient reports no abdominal tenderness. Fundal height is appropriate for gestation.   Estimated fetal weight is 5-6#.   Fetal presentation: vertex  Introitus: Normal vulva. Normal vagina.    Physical Exam  Constitutional: She is oriented to person, place, and time. She appears well-developed and well-nourished.  HENT:  Head: Normocephalic and atraumatic.  Cardiovascular: Normal rate and regular rhythm.  Respiratory: Effort normal and breath sounds normal. No respiratory distress. She has no wheezes.  GI: Soft.  Bowel sounds are normal. She exhibits no distension. There is no abdominal tenderness.  Genitourinary:    Vulva normal.   Musculoskeletal:        General: Normal range of motion.  Neurological: She is alert and oriented to person, place, and time.  Skin: Skin is warm and dry.  Psychiatric: She has a normal mood and affect.    Prenatal labs: ABO, Rh: O/Positive/-- (10/13 0000) Antibody: Negative (10/13 0000) Rubella: Immune (10/13 0000) RPR: Nonreactive (10/13 0000)  HBsAg: Negative (10/13 0000)  HIV: Non-reactive (10/13 0000)  GBS: Negative/-- (03/09 0000)  Tdap in [facility]  Hgb electro WNL/ Plt 314/ Hgb 12.5/RPR+ - confirm neg/ Ur Cx neg/GC neg/Chl neg/glucola 94/UDS neg/TSH elevated/Hep C neg   No result NIPT x 2 MFM referral - persistent dilated umbilical vein, dilated large intestine - not imperforate anus Female, ant plac, nl anat x above    Assessment/Plan: 81LX [id number] at 38+ with IUGR for IOL per MFM recommendations Also some [place] differences - Nursery to be made aware Not well-controlled hypothyroidism IOL with AROM and pitocin IV pain meds, nitrous and/or epidural for pain relief Expect SVD   Morgon Pamer Bovard-Stuckert 10/24/2019, 9:34 PM

## 2019-10-25 ENCOUNTER — Inpatient Hospital Stay (HOSPITAL_COMMUNITY)
Admission: AD | Admit: 2019-10-25 | Discharge: 2019-10-27 | DRG: 806 | Disposition: A | Discharge status: Home / Self Care | Payer: Medicaid Other | Attending: Obstetrics and Gynecology | Admitting: Obstetrics and Gynecology

## 2019-10-25 ENCOUNTER — Inpatient Hospital Stay (HOSPITAL_COMMUNITY): Payer: Medicaid Other

## 2019-10-25 ENCOUNTER — Inpatient Hospital Stay (HOSPITAL_COMMUNITY): Payer: Medicaid Other | Admitting: Anesthesiology

## 2019-10-25 ENCOUNTER — Encounter (HOSPITAL_COMMUNITY): Payer: Self-pay | Admitting: Obstetrics and Gynecology

## 2019-10-25 ENCOUNTER — Other Ambulatory Visit: Payer: Self-pay

## 2019-10-25 DIAGNOSIS — O9832 Other infections with a predominantly sexual mode of transmission complicating childbirth: Secondary | ICD-10-CM | POA: Diagnosis present

## 2019-10-25 DIAGNOSIS — F17210 Nicotine dependence, cigarettes, uncomplicated: Secondary | ICD-10-CM | POA: Diagnosis present

## 2019-10-25 DIAGNOSIS — E039 Hypothyroidism, unspecified: Secondary | ICD-10-CM | POA: Diagnosis present

## 2019-10-25 DIAGNOSIS — F4310 Post-traumatic stress disorder, unspecified: Secondary | ICD-10-CM | POA: Diagnosis present

## 2019-10-25 DIAGNOSIS — O99284 Endocrine, nutritional and metabolic diseases complicating childbirth: Secondary | ICD-10-CM | POA: Diagnosis present

## 2019-10-25 DIAGNOSIS — O99334 Smoking (tobacco) complicating childbirth: Secondary | ICD-10-CM | POA: Diagnosis present

## 2019-10-25 DIAGNOSIS — Z3A38 38 weeks gestation of pregnancy: Secondary | ICD-10-CM | POA: Diagnosis not present

## 2019-10-25 DIAGNOSIS — O365930 Maternal care for other known or suspected poor fetal growth, third trimester, not applicable or unspecified: Principal | ICD-10-CM | POA: Diagnosis present

## 2019-10-25 DIAGNOSIS — O365990 Maternal care for other known or suspected poor fetal growth, unspecified trimester, not applicable or unspecified: Secondary | ICD-10-CM | POA: Diagnosis present

## 2019-10-25 DIAGNOSIS — A6000 Herpesviral infection of urogenital system, unspecified: Secondary | ICD-10-CM | POA: Diagnosis present

## 2019-10-25 DIAGNOSIS — O99344 Other mental disorders complicating childbirth: Secondary | ICD-10-CM | POA: Diagnosis present

## 2019-10-25 LAB — CBC
HCT: 35.1 % — ABNORMAL LOW (ref 36.0–46.0)
Hemoglobin: 11.6 g/dL — ABNORMAL LOW (ref 12.0–15.0)
MCH: 30.2 pg (ref 26.0–34.0)
MCHC: 33 g/dL (ref 30.0–36.0)
MCV: 91.4 fL (ref 80.0–100.0)
Platelets: 261 10*3/uL (ref 150–400)
RBC: 3.84 MIL/uL — ABNORMAL LOW (ref 3.87–5.11)
RDW: 14.3 % (ref 11.5–15.5)
WBC: 13.1 10*3/uL — ABNORMAL HIGH (ref 4.0–10.5)
nRBC: 0 % (ref 0.0–0.2)

## 2019-10-25 LAB — TYPE AND SCREEN
ABO/RH(D): O POS
Antibody Screen: NEGATIVE

## 2019-10-25 LAB — RPR
RPR Ser Ql: REACTIVE — AB
RPR Titer: 1:2 {titer}

## 2019-10-25 LAB — ABO/RH: ABO/RH(D): O POS

## 2019-10-25 MED ORDER — LACTATED RINGERS IV SOLN: INTRAVENOUS | Status: DC

## 2019-10-25 MED ORDER — EPHEDRINE 5 MG/ML INJ: 10.0000 mg | INTRAVENOUS | Status: DC | PRN

## 2019-10-25 MED ORDER — LACTATED RINGERS IV SOLN: 500.0000 mL | Freq: Once | INTRAVENOUS | Status: DC

## 2019-10-25 MED ORDER — ACETAMINOPHEN 325 MG PO TABS: 650.0000 mg | ORAL_TABLET | ORAL | Status: DC | PRN

## 2019-10-25 MED ORDER — LIDOCAINE HCL (PF) 1 % IJ SOLN: 30.0000 mL | INTRAMUSCULAR | Status: DC | PRN

## 2019-10-25 MED ORDER — OXYTOCIN 40 UNITS IN NORMAL SALINE INFUSION - SIMPLE MED
1.0000 m[IU]/min | INTRAVENOUS | Status: DC
  Administered 2019-10-25: 2 m[IU]/min via INTRAVENOUS
  Filled 2019-10-25: qty 1000

## 2019-10-25 MED ORDER — LAMOTRIGINE 150 MG PO TABS
150.0000 mg | ORAL_TABLET | Freq: Two times a day (BID) | ORAL | Status: DC
  Administered 2019-10-25 – 2019-10-27 (×4): 150 mg via ORAL
  Filled 2019-10-25 (×5): qty 1

## 2019-10-25 MED ORDER — ZOLPIDEM TARTRATE 5 MG PO TABS: 5.0000 mg | ORAL_TABLET | Freq: Every evening | ORAL | Status: DC | PRN

## 2019-10-25 MED ORDER — OXYTOCIN 40 UNITS IN NORMAL SALINE INFUSION - SIMPLE MED: 2.5000 [IU]/h | INTRAVENOUS | Status: DC

## 2019-10-25 MED ORDER — ONDANSETRON HCL 4 MG/2ML IJ SOLN: 4.0000 mg | Freq: Four times a day (QID) | INTRAMUSCULAR | Status: DC | PRN

## 2019-10-25 MED ORDER — SOD CITRATE-CITRIC ACID 500-334 MG/5ML PO SOLN
30.0000 mL | ORAL | Status: DC | PRN
  Administered 2019-10-25 (×2): 30 mL via ORAL
  Filled 2019-10-25 (×2): qty 30

## 2019-10-25 MED ORDER — LEVOTHYROXINE SODIUM 75 MCG PO TABS
125.0000 ug | ORAL_TABLET | Freq: Every day | ORAL | Status: DC
  Administered 2019-10-26 – 2019-10-27 (×2): 125 ug via ORAL
  Filled 2019-10-25 (×3): qty 1

## 2019-10-25 MED ORDER — SIMETHICONE 80 MG PO CHEW: 80.0000 mg | CHEWABLE_TABLET | ORAL | Status: DC | PRN

## 2019-10-25 MED ORDER — DIBUCAINE (PERIANAL) 1 % EX OINT: 1.0000 "application " | TOPICAL_OINTMENT | CUTANEOUS | Status: DC | PRN

## 2019-10-25 MED ORDER — TERBUTALINE SULFATE 1 MG/ML IJ SOLN: 0.2500 mg | Freq: Once | INTRAMUSCULAR | Status: DC | PRN

## 2019-10-25 MED ORDER — OXYCODONE-ACETAMINOPHEN 5-325 MG PO TABS: 2.0000 | ORAL_TABLET | ORAL | Status: DC | PRN

## 2019-10-25 MED ORDER — PHENYLEPHRINE 40 MCG/ML (10ML) SYRINGE FOR IV PUSH (FOR BLOOD PRESSURE SUPPORT): 80.0000 ug | PREFILLED_SYRINGE | INTRAVENOUS | Status: DC | PRN

## 2019-10-25 MED ORDER — ONDANSETRON HCL 4 MG/2ML IJ SOLN: 4.0000 mg | INTRAMUSCULAR | Status: DC | PRN

## 2019-10-25 MED ORDER — PRENATAL MULTIVITAMIN CH
1.0000 | ORAL_TABLET | Freq: Every day | ORAL | Status: DC
  Administered 2019-10-26: 12:00:00 1 via ORAL
  Filled 2019-10-25: qty 1

## 2019-10-25 MED ORDER — ONDANSETRON HCL 4 MG PO TABS: 4.0000 mg | ORAL_TABLET | ORAL | Status: DC | PRN

## 2019-10-25 MED ORDER — OXYCODONE HCL 5 MG PO TABS: 10.0000 mg | ORAL_TABLET | ORAL | Status: DC | PRN

## 2019-10-25 MED ORDER — OXYCODONE HCL 5 MG PO TABS: 5.0000 mg | ORAL_TABLET | ORAL | Status: DC | PRN

## 2019-10-25 MED ORDER — SENNOSIDES-DOCUSATE SODIUM 8.6-50 MG PO TABS
2.0000 | ORAL_TABLET | ORAL | Status: DC
  Administered 2019-10-25 – 2019-10-26 (×2): 2 via ORAL
  Filled 2019-10-25 (×2): qty 2

## 2019-10-25 MED ORDER — DIPHENHYDRAMINE HCL 25 MG PO CAPS: 25.0000 mg | ORAL_CAPSULE | Freq: Four times a day (QID) | ORAL | Status: DC | PRN

## 2019-10-25 MED ORDER — BENZOCAINE-MENTHOL 20-0.5 % EX AERO
1.0000 "application " | INHALATION_SPRAY | CUTANEOUS | Status: DC | PRN
  Administered 2019-10-25: 1 via TOPICAL
  Filled 2019-10-25: qty 56

## 2019-10-25 MED ORDER — IBUPROFEN 600 MG PO TABS
600.0000 mg | ORAL_TABLET | Freq: Four times a day (QID) | ORAL | Status: DC
  Administered 2019-10-25 – 2019-10-27 (×7): 600 mg via ORAL
  Filled 2019-10-25 (×7): qty 1

## 2019-10-25 MED ORDER — SODIUM CHLORIDE (PF) 0.9 % IJ SOLN
INTRAMUSCULAR | Status: DC | PRN
  Administered 2019-10-25: 12 mL/h via EPIDURAL

## 2019-10-25 MED ORDER — ACETAMINOPHEN 325 MG PO TABS
650.0000 mg | ORAL_TABLET | ORAL | Status: DC | PRN
  Administered 2019-10-26 (×2): 650 mg via ORAL
  Filled 2019-10-25 (×2): qty 2

## 2019-10-25 MED ORDER — OXYCODONE-ACETAMINOPHEN 5-325 MG PO TABS: 1.0000 | ORAL_TABLET | ORAL | Status: DC | PRN

## 2019-10-25 MED ORDER — BUTORPHANOL TARTRATE 1 MG/ML IJ SOLN: 1.0000 mg | INTRAMUSCULAR | Status: DC | PRN

## 2019-10-25 MED ORDER — COCONUT OIL OIL: 1.0000 "application " | TOPICAL_OIL | Status: DC | PRN

## 2019-10-25 MED ORDER — FENTANYL-BUPIVACAINE-NACL 0.5-0.125-0.9 MG/250ML-% EP SOLN
12.0000 mL/h | EPIDURAL | Status: DC | PRN
  Filled 2019-10-25: qty 250

## 2019-10-25 MED ORDER — DIPHENHYDRAMINE HCL 50 MG/ML IJ SOLN: 12.5000 mg | INTRAMUSCULAR | Status: DC | PRN

## 2019-10-25 MED ORDER — WITCH HAZEL-GLYCERIN EX PADS: 1.0000 "application " | MEDICATED_PAD | CUTANEOUS | Status: DC | PRN

## 2019-10-25 MED ORDER — LACTATED RINGERS IV SOLN: 500.0000 mL | INTRAVENOUS | Status: DC | PRN

## 2019-10-25 MED ORDER — LIDOCAINE HCL (PF) 1 % IJ SOLN
INTRAMUSCULAR | Status: DC | PRN
  Administered 2019-10-25: 10 mL via EPIDURAL
  Administered 2019-10-25: 1 mL via EPIDURAL

## 2019-10-25 MED ORDER — OXYTOCIN BOLUS FROM INFUSION: 500.0000 mL | Freq: Once | INTRAVENOUS | Status: DC

## 2019-10-25 NOTE — Anesthesia Preprocedure Evaluation (Signed)
Anesthesia Evaluation  Patient identified by MRN, date of birth, ID band Patient awake    Reviewed: Allergy & Precautions, Patient's Chart, lab work & pertinent test results  Airway Mallampati: I  TM Distance: >3 FB Neck ROM: Full    Dental no notable dental hx. (+) Teeth Intact, Dental Advisory Given   Pulmonary Current Smoker,    Pulmonary exam normal breath sounds clear to auscultation       Cardiovascular negative cardio ROS Normal cardiovascular exam Rhythm:Regular Rate:Normal     Neuro/Psych PSYCHIATRIC DISORDERS Anxiety Depression Bipolar Disorder Schizophrenia negative neurological ROS     GI/Hepatic negative GI ROS, (+)     substance abuse  cocaine use, methamphetamine use and IV drug use,   Endo/Other  Hypothyroidism   Renal/GU   negative genitourinary   Musculoskeletal Scoliosis    Abdominal Normal abdominal exam  (+)   Peds  Hematology  (+) Blood dyscrasia, anemia , hct 35/1, plt 261   Anesthesia Other Findings   Reproductive/Obstetrics (+) Pregnancy Last epidural placed within of delivery, pt states didn't have any pain relief                             Anesthesia Physical Anesthesia Plan  ASA: II and emergent  Anesthesia Plan: Epidural   Post-op Pain Management:    Induction:   PONV Risk Score and Plan: 2  Airway Management Planned: Natural Airway  Additional Equipment: None  Intra-op Plan:   Post-operative Plan:   Informed Consent: I have reviewed the patients History and Physical, chart, labs and discussed the procedure including the risks, benefits and alternatives for the proposed anesthesia with the patient or authorized representative who has indicated his/her understanding and acceptance.       Plan Discussed with:   Anesthesia Plan Comments:         Anesthesia Quick Evaluation

## 2019-10-25 NOTE — Progress Notes (Signed)
Patient ID: Monica Patel, female   DOB: Oct 12, 1990, 29 y.o.   MRN: [id number]  IOL 38+ IUGR/dilated bowel  Comfortable w epidural  AFVSS gen NAD FHTs 120-130's, mod var, + accels toco q 2-3 min  SVE 4.5 per RN  Continue IOL, expect SVD

## 2019-10-25 NOTE — Anesthesia Procedure Notes (Signed)
Epidural Patient location during procedure: OB Start time: 10/25/2019 8:26 AM End time: 10/25/2019 8:35 AM  Staffing Anesthesiologist: Lannie Fields, DO Performed: anesthesiologist   Preanesthetic Checklist Completed: patient identified, IV checked, risks and benefits discussed, monitors and equipment checked, pre-op evaluation and timeout performed  Epidural Patient position: sitting Prep: DuraPrep and site prepped and draped Patient monitoring: continuous pulse ox, blood pressure, heart rate and cardiac monitor Approach: midline Location: L3-L4 Injection technique: LOR air  Needle:  Needle type: Tuohy  Needle gauge: 17 G Needle length: 9 cm Needle insertion depth: 5.5 cm Catheter type: closed end flexible Catheter size: 19 Gauge Catheter at skin depth: 11 cm Test dose: negative  Assessment Sensory level: T8 Events: blood not aspirated, injection not painful, no injection resistance, no paresthesia and negative IV test  Additional Notes Patient identified. Risks/Benefits/Options discussed with patient including but not limited to bleeding, infection, nerve damage, paralysis, failed block, incomplete pain control, headache, blood pressure changes, nausea, vomiting, reactions to medication both or allergic, itching and postpartum back pain. Confirmed with bedside nurse the patient's most recent platelet count. Confirmed with patient that they are not currently taking any anticoagulation, have any bleeding history or any family history of bleeding disorders. Patient expressed understanding and wished to proceed. All questions were answered. Sterile technique was used throughout the entire procedure. Please see nursing notes for vital signs. Test dose was given through epidural catheter and negative prior to continuing to dose epidural or start infusion. Warning signs of high block given to the patient including shortness of breath, tingling/numbness in hands, complete motor  block, or any concerning symptoms with instructions to call for help. Patient was given instructions on fall risk and not to get out of bed. All questions and concerns addressed with instructions to call with any issues or inadequate analgesia.  Reason for block:procedure for pain

## 2019-10-25 NOTE — Progress Notes (Signed)
Patient ID: Monica Patel, female   DOB: [birth year], 29 y.o.   MRN: [id number]  H&P reviewed.  No changes  AFVSS gen NAD FHTs 135's mod var, category 1, + accels toco irr  SVE 2cm per RN  Plan for AROM Expect SVD, Nursery to be aware of IUGR and US findings

## 2019-10-26 LAB — CBC
HCT: 34.2 % — ABNORMAL LOW (ref 36.0–46.0)
Hemoglobin: 11.5 g/dL — ABNORMAL LOW (ref 12.0–15.0)
MCH: 30.7 pg (ref 26.0–34.0)
MCHC: 33.6 g/dL (ref 30.0–36.0)
MCV: 91.2 fL (ref 80.0–100.0)
Platelets: 225 10*3/uL (ref 150–400)
RBC: 3.75 MIL/uL — ABNORMAL LOW (ref 3.87–5.11)
RDW: 14.2 % (ref 11.5–15.5)
WBC: 14.6 10*3/uL — ABNORMAL HIGH (ref 4.0–10.5)
nRBC: 0 % (ref 0.0–0.2)

## 2019-10-26 LAB — T.PALLIDUM AB, TOTAL: T Pallidum Abs: NONREACTIVE

## 2019-10-26 MED ORDER — IBUPROFEN 600 MG PO TABS: 600.0000 mg | ORAL_TABLET | Freq: Four times a day (QID) | ORAL | 0 refills | Status: AC

## 2019-10-26 NOTE — Anesthesia Postprocedure Evaluation (Signed)
Anesthesia Post Note  Patient: Monica Patel  Procedure(s) Performed: AN AD HOC LABOR EPIDURAL     Patient location during evaluation: Mother Baby Anesthesia Type: Epidural Level of consciousness: awake Pain management: satisfactory to patient Vital Signs Assessment: post-procedure vital signs reviewed and stable Respiratory status: spontaneous breathing Cardiovascular status: stable Anesthetic complications: no    Last Vitals:  Vitals:   10/25/19 2317 10/26/19 0418  BP: 106/63 102/63  Pulse: 68 70  Resp: 16 16  Temp: 36.6 C 36.8 C  SpO2: 99% 98%    Last Pain:  Vitals:   10/26/19 0712  TempSrc:   PainSc: 9    Pain Goal:                   Cephus Shelling

## 2019-10-26 NOTE — Clinical Social Work Maternal (Signed)
CLINICAL SOCIAL WORK MATERNAL/CHILD NOTE  Patient Details  Name: Monica Patel MRN: [id number] Date of Birth: 24-Mar-1991  Date:  10/26/2019  Clinical Social Worker Initiating Note:  Laurey Arrow Date/Time: Initiated:  10/26/19/1152     Child's Name:  Keane Police   Biological Parents:  Mother, Father   Need for Interpreter:  None   Reason for Referral:  Behavioral Health Concerns   Address:  [address] [place] [address]    Phone number:  [phone] (home)     Additional phone number: FOB's number is [phone]  Household Members/Support Persons (HM/SP):   Household Member/Support Person 1, Household Member/Support Person 2, Household Member/Support Person 3   HM/SP Name Relationship DOB or Age  HM/SP -1 Gerilyn Pilgrim FOB 10/15/1984  HM/SP -2 Koby Cayton son 09/25/2010  HM/SP -3 [name] son 11/28/208  HM/SP -4        HM/SP -5        HM/SP -6        HM/SP -7        HM/SP -8          Natural Supports (not living in the home):  Extended Family, Immediate Family, Parent   Professional Supports: Therapist(MOB reported that she is an established patient with [facility]   Employment: Unemployed   Type of Work:     Education:  [place] arranged:    Museum/gallery curator Resources:  Medicaid   Other Resources:  WIC(Per MOB her household income is too much for Electronics engineer.)   Cultural/Religious Considerations Which May Impact Care:  None reported  Strengths:  Ability to meet basic needs , Compliance with medical plan , Home prepared for child , Understanding of illness, Pediatrician chosen, Psychotropic Medications   Psychotropic Medications:  Lamictal      Pediatrician:    [place]  Pediatrician List:   Lavella Hammock, [place]      Pediatrician Fax Number:    Risk Factors/Current Problems:  Mental Health Concerns     Cognitive State:  Able to Concentrate , Alert , Goal Oriented , Insightful , Linear Thinking    Mood/Affect:  Comfortable , Interested , Happy , Bright , Calm    CSW Assessment: CSW met with MOB in room 518 to complete an assessment for MH hx. When CSW arrived, MOB was bonding with infant as evidence by bottle feeding infant and engaging in infant massages; MOB and infant appeared happy and comfortable. CSW explained CSW's role and MOB reported that she remembered CSW from 2018 when she gave birth to her second child.  MOB was polite, easy to engage, and receptive to meeting with CSW.  CSW asked about MOB's MH hx and MOB opnely shared a dx of anxiety, depression, Bipolar, PTSD, and "Mild Schizophrenia."  Per MOB she continues to be an established patient with [facility]. However, since the Pandemic, MOB has had little to no appointments. MOB shared that she has been receiving medication management with her OB provider. MOB reported that she was dx with anxiety/depression and bipolar dx around age 39, PTSD in 2017, and "Mild Schizophrenia in 2018. MOB presented with insight and awareness and did not demonstrate any acute MH symptoms. MOB shared that she has a good support team and feels comfortable seeking help if warranted.  CSW provided education regarding the baby  blues period vs. perinatal mood disorders, discussed treatment and gave resources for mental health follow up if concerns arise.  CSW recommends self-evaluation during the postpartum time period using the New Mom Checklist from Postpartum Progress and encouraged MOB to contact a medical professional if symptoms are noted at any time.  CSW assessed for safety and MOB denied SI, HI, and DV.   CSW provided review of Sudden Infant Death Syndrome (SIDS) precautions.    MOB reports having all essential items for infant an d expressed feeling prepared to parent.   CSW assessed for substance use and MOB denied the use of all illicit  substance.   CSW identifies no further need for intervention and no barriers to discharge at this time.   CSW Plan/Description:  No Further Intervention Required/No Barriers to Discharge, Sudden Infant Death Syndrome (SIDS) Education, Perinatal Mood and Anxiety Disorder (PMADs) Education, Other Patient/Family Education, Other Information/Referral to [employer], MSW, [employer] Clinical Social Work [phone]   Dimple Nanas, LCSW 10/26/2019, 12:01 PM

## 2019-10-26 NOTE — Discharge Instructions (Signed)
As per discharge pamphlet °

## 2019-10-26 NOTE — Progress Notes (Signed)
Patient's RPR reactive, titer 1:2, TPPA pending. Notified Dr. Jackelyn Knife. Discharge home order canceled.

## 2019-10-26 NOTE — Progress Notes (Signed)
PPD #1 No problems, wants to go home today Afeb, VSS Fundus firm, NT at U-1 Continue routine postpartum care, discharge home late this afternoon unless has problems today

## 2019-10-26 NOTE — Progress Notes (Signed)
Not going home today RPR positive with 1:2 titer, waiting for specific treponemal test result, hopefully a false positive

## 2019-10-26 NOTE — Lactation Note (Signed)
This note was copied from a baby's chart. Lactation Consultation Note Baby 10 hrs old. Mom's 3rd child. Mom states baby has been BF frequently.  LPI information sheet given. Reviewed timed feeding and supplementing. Mom stated she chooses to give formula as supplement d/t baby will be breast/bottle at home. Gave mom Similac 22 cal. W/purple nipples.  Experienced BF, mom BF her 30 yr old for 1 yr, her 2nd child pumped d/t baby at [facility] and had feeding tube.  Mom denies painful latching. Has good everted nipples. States has colostrum. Newborn feeding habits, STS, importance of I&O, milk storage, breast massage, supply and demand discussed.  Discussed pumping. Mom agreed. Mom shown how to use DEBP & how to disassemble, clean, & reassemble parts. Mom knows to pump q3h for 15-20 min. Mom encouraged to feed baby 8-12 times/24 hours and with feeding cues.  Mom encouraged to waken baby for feedings if hasn't cued in 3 hrs.   Encouraged mom to call for questions or concerns. Lactation brochure given.  Patient Name: Monica Patel WFUXN'A Date: 10/26/2019 Reason for consult: Initial assessment;Early term 37-38.6wks;Infant < 6lbs   Maternal Data Has patient been taught Hand Expression?: Yes Does the patient have breastfeeding experience prior to this delivery?: Yes  Feeding Feeding Type: Formula Nipple Type: Extra Slow Flow  LATCH Score       Type of Nipple: Everted at rest and after stimulation  Comfort (Breast/Nipple): Soft / non-tender        Interventions Interventions: Breast feeding basics reviewed;Support pillows;Skin to skin;DEBP  Lactation Tools Discussed/Used Tools: Pump Breast pump type: Double-Electric Breast Pump WIC Program: No Pump Review: Setup, frequency, and cleaning;Milk Storage Initiated by:: Peri Jefferson RN IBCLC Date initiated:: 10/26/19   Consult Status Consult Status: Follow-up Date: 10/27/19 Follow-up type: In-patient    Shronda Boeh, Diamond Nickel 10/26/2019, 1:50 AM

## 2019-10-27 NOTE — Lactation Note (Signed)
This note was copied from a baby's chart. Lactation Consultation Note  Patient Name: Monica Patel IOMBT'D Date: 10/27/2019 Reason for consult: Follow-up assessment   Baby 41 hours old and latched upon entering.  Sleepy at breast but had intermittent swallows. Mother is supplementing with formula after feedings. Denies questions or concerns.  Ex BF. Mother has DEBP at home.  Suggest she post pump and supplement with her breastmilk if desired. Feed on demand with cues.  Goal 8-12+ times per day after first 24 hrs.  Place baby STS if not cueing.  Reviewed engorgement care and monitoring voids/stools.    Maternal Data    Feeding Feeding Type: Breast Fed Nipple Type: Nfant Slow Flow (purple)  LATCH Score Latch: Grasps breast easily, tongue down, lips flanged, rhythmical sucking.(latched upon entering)  Audible Swallowing: A few with stimulation  Type of Nipple: Everted at rest and after stimulation  Comfort (Breast/Nipple): Soft / non-tender  Hold (Positioning): No assistance needed to correctly position infant at breast.  LATCH Score: 9  Interventions Interventions: DEBP  Lactation Tools Discussed/Used     Consult Status Consult Status: Complete Date: 10/27/19    Dahlia Byes [facility] 10/27/2019, 8:31 AM

## 2019-10-27 NOTE — Discharge Summary (Signed)
OB Discharge Summary     Patient Name: Monica Patel DOB: Nov 23, 1990 MRN: [id number]  Date of admission: 10/25/2019 Delivering MD: Sherian Rein   Date of discharge: 10/27/2019  Admitting diagnosis: IUGR (intrauterine growth restriction) affecting care of mother [O36.5990] Intrauterine pregnancy: 38 6/7 Secondary diagnosis:  Principal Problem:   SVD (spontaneous vaginal delivery) Active Problems:   IUGR (intrauterine growth restriction) affecting care of mother  Additional problems: PTSD/BPD, hypothyroidism, HSV, h/o PTD     Discharge diagnosis: Term Pregnancy Delivered                                                                                                Post partum procedures:none  Augmentation: AROM, Pitocin and Cytotec  Complications: None  Hospital course:  Induction of Labor With Vaginal Delivery   29 y.o. yo [phone] at Unknown was admitted to the hospital 10/25/2019 for induction of labor.  Indication for induction: IUGR.  Patient had an uncomplicated labor course as follows: Membrane Rupture Time/Date: 9:25 AM ,10/25/2019   Intrapartum Procedures: Episiotomy: None [1]                                         Lacerations:  Periurethral [8]  Patient had delivery of a Viable infant.  Information for the patient's newborn:  Coreena, Rubalcava [[id number]]  Delivery Method: Vag-Spont    10/25/2019  Details of delivery can be found in separate delivery note.  Patient had a routine postpartum course. Patient is discharged home 10/27/19.  Physical exam  Vitals:   10/26/19 0855 10/26/19 1338 10/26/19 2144 10/27/19 0526  BP: (!) 102/59 (!) 97/54 108/69 (!) 95/58  Pulse: 63 (!) 59 (!) 57 80  Resp: 20 18  16   Temp: 97.6 F (36.4 C) 98 F (36.7 C) 98.2 F (36.8 C) 98 F (36.7 C)  TempSrc: Oral Oral Axillary Oral  SpO2:      Weight:      Height:       General: alert, cooperative and no distress Lochia: appropriate Uterine Fundus: firm Incision: N/A DVT  Evaluation: No evidence of DVT seen on physical exam. Negative Homan's sign. No cords or calf tenderness. Labs: Lab Results  Component Value Date   WBC 14.6 (H) 10/26/2019   HGB 11.5 (L) 10/26/2019   HCT 34.2 (L) 10/26/2019   MCV 91.2 10/26/2019   PLT 225 10/26/2019   CMP Latest Ref Rng & Units 06/02/2017  Glucose 65 - 99 mg/dL 71  BUN 6 - 20 mg/dL 9  Creatinine 13/01/2017 - 6.31 mg/dL 4.97  Sodium 0.26 - 378 mmol/L 136  Potassium 3.5 - 5.1 mmol/L 4.0  Chloride 101 - 111 mmol/L 102  CO2 22 - 32 mmol/L 21(L)  Calcium 8.9 - 10.3 mg/dL 9.0  Total Protein 6.5 - 8.1 g/dL 6.5  Total Bilirubin 0.3 - 1.2 mg/dL 1.0  Alkaline Phos 38 - 126 U/L 154(H)  AST 15 - 41 U/L 17  ALT 14 - 54 U/L 12(L)  Discharge instruction: per After Visit Summary and "Baby and Me Booklet".  After visit meds:  Allergies as of 10/27/2019      Reactions   Nickel Hives, Rash      Medication List    STOP taking these medications   FOLIC ACID PO   MAKENA IM   valACYclovir 1000 MG tablet Commonly known as: VALTREX     TAKE these medications   acetaminophen 325 MG tablet Commonly known as: TYLENOL Take 650 mg by mouth every 6 (six) hours as needed for moderate pain.   ATARAX PO Take by mouth.   ibuprofen 600 MG tablet Commonly known as: ADVIL Take 1 tablet (600 mg total) by mouth every 6 (six) hours. What changed:   when to take this  reasons to take this   IRON PO Take by mouth.   lamoTRIgine 150 MG tablet Commonly known as: LAMICTAL Take 150 mg by mouth 2 (two) times daily.   levothyroxine 125 MCG tablet Commonly known as: SYNTHROID Take 125 mcg by mouth daily before breakfast.   LORazepam 0.5 MG tablet Commonly known as: ATIVAN Take 0.5 mg by mouth at bedtime as needed for anxiety or sleep.   Prenatal Vitamins 0.8 MG tablet Take 1 tablet by mouth daily.   UNISOM PO Take by mouth.       Diet: routine diet  Activity: Advance as tolerated. Pelvic rest for 6 weeks.    Outpatient follow up:6 weeks Follow up Appt:No future appointments. Follow up Visit:No follow-ups on file.  Postpartum contraception: IUD Mirena  Newborn Data: Live born female  Birth Weight: 5 lb 8.2 oz (2500 g) APGAR: 43, 9  Newborn Delivery   Birth date/time: 10/25/2019 15:05:00 Delivery type: Vaginal, Spontaneous      Baby Feeding: Breast Disposition:home with mother   10/27/2019 Deliah Boston, MD

## 2019-10-27 NOTE — Progress Notes (Signed)
POSTPARTUM PROGRESS NOTE  Post Partum Day #2  Subjective:  No acute events overnight.  Pt denies problems with ambulating, voiding or po intake.  She denies nausea or vomiting.  Pain is well controlled.    Lochia Minimal. Reviewed confirmatory testing was negative, pt understands. Desires Mirena IUD for pp contraception  Objective: Blood pressure (!) 95/58, pulse 80, temperature 98 F (36.7 C), temperature source Oral, resp. rate 16, height 5\' 6"  (1.676 m), weight 69.5 kg, SpO2 98 %, unknown if currently breastfeeding.  Physical Exam:  General: alert, cooperative and no distress Lochia:normal flow Chest: CTAB Heart: RRR no m/r/g Abdomen: +BS, soft, nontender Uterine Fundus: firm, 3cm below umbilicus Extremities: neg edema, neg calf TTP BL, neg Homans BL  Recent Labs    10/25/19 0656 10/26/19 0442  HGB 11.6* 11.5*  HCT 35.1* 34.2*    Assessment/Plan:  ASSESSMENT: Monica Patel is a 29 y.o. 37 s/p SVD @ 38 6/7. PNC c/b IUGR @ 4th%tile, hypothyroidism on meds, PTSD on Rx, HSV. Fetal scans w/ dilated large bowel, inpatient exams normal. Ready for discharge today.   Discharge home, Breastfeeding and Contraception Mirena IUD  Continue Lamictal plus levothyroixine as ordered. Peds has cleared baby girl.    LOS: 2 days

## 2019-11-03 ENCOUNTER — Inpatient Hospital Stay (HOSPITAL_COMMUNITY): Payer: Medicaid Other

## 2023-12-18 ENCOUNTER — Other Ambulatory Visit: Payer: Self-pay

## 2023-12-18 ENCOUNTER — Emergency Department (HOSPITAL_COMMUNITY)

## 2023-12-18 ENCOUNTER — Emergency Department (HOSPITAL_COMMUNITY)
Admission: EM | Admit: 2023-12-18 | Discharge: 2023-12-18 | Disposition: A | Discharge status: Home / Self Care | Attending: Emergency Medicine | Admitting: Emergency Medicine

## 2023-12-18 DIAGNOSIS — J189 Pneumonia, unspecified organism: Secondary | ICD-10-CM

## 2023-12-18 DIAGNOSIS — D72829 Elevated white blood cell count, unspecified: Secondary | ICD-10-CM | POA: Diagnosis not present

## 2023-12-18 DIAGNOSIS — J188 Other pneumonia, unspecified organism: Secondary | ICD-10-CM | POA: Insufficient documentation

## 2023-12-18 DIAGNOSIS — R059 Cough, unspecified: Secondary | ICD-10-CM | POA: Diagnosis present

## 2023-12-18 LAB — CBC
HCT: 43.7 % (ref 36.0–46.0)
Hemoglobin: 14.3 g/dL (ref 12.0–15.0)
MCH: 28.7 pg (ref 26.0–34.0)
MCHC: 32.7 g/dL (ref 30.0–36.0)
MCV: 87.6 fL (ref 80.0–100.0)
Platelets: 289 10*3/uL (ref 150–400)
RBC: 4.99 MIL/uL (ref 3.87–5.11)
RDW: 12.2 % (ref 11.5–15.5)
WBC: 14.5 10*3/uL — ABNORMAL HIGH (ref 4.0–10.5)
nRBC: 0 % (ref 0.0–0.2)

## 2023-12-18 LAB — RESP PANEL BY RT-PCR (RSV, FLU A&B, COVID)  RVPGX2
Influenza A by PCR: NEGATIVE
Influenza B by PCR: NEGATIVE
Resp Syncytial Virus by PCR: NEGATIVE
SARS Coronavirus 2 by RT PCR: NEGATIVE

## 2023-12-18 LAB — HCG, SERUM, QUALITATIVE: Preg, Serum: NEGATIVE

## 2023-12-18 LAB — BASIC METABOLIC PANEL WITH GFR
Anion gap: 13 (ref 5–15)
BUN: 7 mg/dL (ref 6–20)
CO2: 20 mmol/L — ABNORMAL LOW (ref 22–32)
Calcium: 9.4 mg/dL (ref 8.9–10.3)
Chloride: 103 mmol/L (ref 98–111)
Creatinine, Ser: 0.75 mg/dL (ref 0.44–1.00)
GFR, Estimated: >60 mL/min (ref >60)
Glucose, Bld: 102 mg/dL — ABNORMAL HIGH (ref 70–99)
Potassium: 4.3 mmol/L (ref 3.5–5.1)
Sodium: 136 mmol/L (ref 135–145)

## 2023-12-18 LAB — D-DIMER, QUANTITATIVE: D-Dimer, Quant: 0.73 ug{FEU}/mL — ABNORMAL HIGH (ref 0.00–0.50)

## 2023-12-18 LAB — TROPONIN I (HIGH SENSITIVITY): Troponin I (High Sensitivity): 3 ng/L (ref <18)

## 2023-12-18 MED ORDER — IOHEXOL 350 MG/ML SOLN
75.0000 mL | Freq: Once | INTRAVENOUS | Status: AC | PRN
  Administered 2023-12-18: 75 mL via INTRAVENOUS

## 2023-12-18 MED ORDER — KETOROLAC TROMETHAMINE 15 MG/ML IJ SOLN
15.0000 mg | Freq: Once | INTRAMUSCULAR | Status: AC
  Administered 2023-12-18: 15 mg via INTRAVENOUS
  Filled 2023-12-18: qty 1

## 2023-12-18 MED ORDER — AMOXICILLIN-POT CLAVULANATE 875-125 MG PO TABS: 1.0000 | ORAL_TABLET | Freq: Two times a day (BID) | ORAL | 0 refills | Status: AC

## 2023-12-18 MED ORDER — ACETAMINOPHEN 325 MG PO TABS
650.0000 mg | ORAL_TABLET | Freq: Once | ORAL | Status: AC
  Administered 2023-12-18: 650 mg via ORAL
  Filled 2023-12-18: qty 2

## 2023-12-18 MED ORDER — AZITHROMYCIN 250 MG PO TABS: 250.0000 mg | ORAL_TABLET | Freq: Every day | ORAL | 0 refills | Status: AC

## 2023-12-18 MED ORDER — SODIUM CHLORIDE 0.9 % IV SOLN
2.0000 g | Freq: Once | INTRAVENOUS | Status: AC
  Administered 2023-12-18: 2 g via INTRAVENOUS
  Filled 2023-12-18: qty 20

## 2023-12-18 MED ORDER — AZITHROMYCIN 250 MG PO TABS
500.0000 mg | ORAL_TABLET | Freq: Once | ORAL | Status: AC
  Administered 2023-12-18: 500 mg via ORAL
  Filled 2023-12-18: qty 2

## 2023-12-18 NOTE — ED Triage Notes (Signed)
 Patient reports left shoulder pain for 2 days and left lateral ribcage pain this morning worse with deep inspiration , denies injury, occasional dry cough , unrelieved by OTC Ibuprofen  .

## 2023-12-18 NOTE — Discharge Instructions (Addendum)
 The CT scan and CXR showed a multifocal pneumonia involving several lobes of your lung.  I recommended admission to the hospital.  Take all of the antibiotics until they are finished.  Please follow-up with your doctor next week since you opted for home treatment.  They might consider referring you to a pulmonary doctor.  Return to the hospital immediately if you have worsening symptoms including lightheadedness, persistent high fevers, shortness of breath or other concerns.

## 2023-12-18 NOTE — ED Provider Notes (Signed)
 [facility] AT [facility] Provider Note   CSN: [id number] Arrival date & time: 12/18/23  4034     History  Chief Complaint  Patient presents with   Shoulder / Ribcage Pain    Monica Patel is a 33 y.o. female.  HPI   Patient has a history of depression, mania bipolar disorder, PTSD, polysubstance use.  Patient presents to the ED with complaints of shoulder pain and rib cage pain.  Sx started a couple of days ago.  Sx increased this am.  Pain increases with breathing.  Some dyspnea. Some coughing.  Pt does smoke.   Home Medications Prior to Admission medications   Medication Sig Start Date End Date Taking? Authorizing Provider  amoxicillin -clavulanate (AUGMENTIN) 875-125 MG tablet Take 1 tablet by mouth every 12 (twelve) hours. 12/18/23  Yes Trish Furl, MD  azithromycin (ZITHROMAX) 250 MG tablet Take 1 tablet (250 mg total) by mouth daily. Take first 2 tablets together, then 1 every day until finished. 12/18/23  Yes Trish Furl, MD  acetaminophen  (TYLENOL ) 325 MG tablet Take 650 mg by mouth every 6 (six) hours as needed for moderate pain.     [provider]  Doxylamine Succinate, Sleep, (UNISOM PO) Take by mouth.    [provider]  Ferrous Sulfate (IRON PO) Take by mouth.    [provider]  hydrOXYzine HCl (ATARAX PO) Take by mouth.    [provider]  ibuprofen  (ADVIL ) 600 MG tablet Take 1 tablet (600 mg total) by mouth every 6 (six) hours. 10/26/19   Meisinger, Ena Harries, MD  lamoTRIgine  (LAMICTAL ) 150 MG tablet Take 150 mg by mouth 2 (two) times daily.    [provider]  levothyroxine  (SYNTHROID , LEVOTHROID) 125 MCG tablet Take 125 mcg by mouth daily before breakfast.    [provider]  LORazepam (ATIVAN) 0.5 MG tablet Take 0.5 mg by mouth at bedtime as needed for anxiety or sleep.    [provider]  Prenatal Multivit-Min-Fe-FA (PRENATAL VITAMINS) 0.8 MG tablet Take 1 tablet by mouth daily.  01/25/17   Harlee Lichtenstein, CNM      Allergies    Nickel    Review of Systems   Review of Systems  Physical Exam Updated Vital Signs BP 126/79 (BP Location: Right Arm)   Pulse (!) 103   Temp 98.1 F (36.7 C)   Resp 16   SpO2 99%  Physical Exam Vitals and nursing note reviewed.  Constitutional:      General: She is not in acute distress.    Appearance: She is well-developed.  HENT:     Head: Normocephalic and atraumatic.     Right Ear: External ear normal.     Left Ear: External ear normal.  Eyes:     General: No scleral icterus.       Right eye: No discharge.        Left eye: No discharge.     Conjunctiva/sclera: Conjunctivae normal.  Neck:     Trachea: No tracheal deviation.  Cardiovascular:     Rate and Rhythm: Normal rate and regular rhythm.  Pulmonary:     Effort: Pulmonary effort is normal. No respiratory distress.     Breath sounds: Normal breath sounds. No stridor. No wheezing or rales.  Abdominal:     General: Bowel sounds are normal. There is no distension.     Palpations: Abdomen is soft.     Tenderness: There is no abdominal tenderness. There is no  guarding or rebound.  Musculoskeletal:        General: No tenderness or deformity.     Cervical back: Neck supple.  Skin:    General: Skin is warm and dry.     Findings: No rash.  Neurological:     General: No focal deficit present.     Mental Status: She is alert.     Cranial Nerves: No cranial nerve deficit, dysarthria or facial asymmetry.     Sensory: No sensory deficit.     Motor: No abnormal muscle tone or seizure activity.     Coordination: Coordination normal.  Psychiatric:        Mood and Affect: Mood normal.     ED Results / Procedures / Treatments   Labs (all labs ordered are listed, but only abnormal results are displayed) Labs Reviewed  BASIC METABOLIC PANEL WITH GFR - Abnormal; Notable for the following components:      Result Value   CO2 20 (*)    Glucose, Bld 102 (*)    All other  components within normal limits  CBC - Abnormal; Notable for the following components:   WBC 14.5 (*)    All other components within normal limits  D-DIMER, QUANTITATIVE - Abnormal; Notable for the following components:   D-Dimer, Quant 0.73 (*)    All other components within normal limits  RESP PANEL BY RT-PCR (RSV, FLU A&B, COVID)  [id number]  HCG, SERUM, QUALITATIVE  TROPONIN I (HIGH SENSITIVITY)    EKG EKG Interpretation Date/Time:  Saturday Dec 18 2023 09:27:35 EDT Ventricular Rate:  93 PR Interval:  130 QRS Duration:  86 QT Interval:  364 QTC Calculation: 452 R Axis:   67  Text Interpretation: Normal sinus rhythm Normal ECG When compared with ECG of 29-May-2016 19:53, No significant change since last tracing Confirmed by Trish Furl [id number]) on 12/18/2023 9:31:05 AM  Radiology CT Angio Chest PE W and/or Wo Contrast Result Date: 12/18/2023 CLINICAL DATA:  Left chest pain, left shoulder pain, pleuritic chest pain and cough. Chest x-ray demonstrating multifocal airspace disease. EXAM: CT ANGIOGRAPHY CHEST WITH CONTRAST TECHNIQUE: Multidetector CT imaging of the chest was performed using the standard protocol during bolus administration of intravenous contrast. Multiplanar CT image reconstructions and MIPs were obtained to evaluate the vascular anatomy. RADIATION DOSE REDUCTION: This exam was performed according to the departmental dose-optimization program which includes automated exposure control, adjustment of the mA and/or kV according to patient size and/or use of iterative reconstruction technique. CONTRAST:  75mL OMNIPAQUE IOHEXOL 350 MG/ML SOLN COMPARISON:  Chest x-ray earlier today FINDINGS: Cardiovascular: The pulmonary arteries are adequately opacified. There is no evidence of pulmonary embolism. Central pulmonary arteries are of normal caliber. The thoracic aorta is normal in caliber. No significant aortic atherosclerosis. The heart size is normal. No pericardial fluid identified. No  visualized calcified coronary artery plaque. Mediastinum/Nodes: Mildly prominent lymph nodes including 8 mm pretracheal lymph node just above the carina, 7 mm AP window node and 12 mm right hilar and 9 mm left hilar lymph nodes are suggestive of reactive lymph nodes given pulmonary findings described below. No overt tracheal or esophageal abnormalities. The thyroid  gland appears within normal limits. Lungs/Pleura: Significant multifocal pulmonary airspace disease bilaterally. In the right lung, dominant area of dense rounded airspace disease in the posterior right upper lobe over a geographic region measuring up to approximately 3.8 cm in diameter with some surrounding satellite areas of airspace nodularity, additional irregular airspace disease in the anterior and inferior aspect  of the right upper lobe abutting the minor fissure and mild airspace opacity in the inferior aspect of the right middle lobe. Right upper lobe airspace disease is associated with areas of subsegmental bronchial occlusion. In the left lung, there is dense airspace disease and collapse associated with the inferior lingula with airway occlusion. Dense consolidation is also noted in the inferior left lower lobe associated with airway occlusion in two separate segmental distributions in the posteromedial and posterolateral basilar left lower lobe extending to the subpleural lung. No associated pleural effusions bilaterally, pneumothorax or pulmonary edema. Upper Abdomen: No acute abnormality. Musculoskeletal: No chest wall abnormality. No acute or significant osseous findings. Review of the MIP images confirms the above findings. IMPRESSION: 1. No evidence of pulmonary embolism. 2. Significant multifocal pulmonary airspace disease bilaterally. Right upper lobe, inferior lingula and left lower lobe airspace disease is associated with significant consolidation and segmental airway occlusion. Mild airspace disease in the inferior right middle  lobe. Findings are most likely consistent with multifocal pneumonia. Distribution and appearance may be consistent with aspiration pneumonia. Findings are unusual and close clinical follow-up recommended with pulmonology consultation potentially helpful. Malignant etiology is felt to be much less likely. 3. Mildly prominent mediastinal and bilateral hilar lymph nodes are suggestive of reactive lymph nodes given pulmonary findings described above. Electronically Signed   By: Erica Hau M.D.   On: 12/18/2023 10:45   DG Chest 2 View Result Date: 12/18/2023 CLINICAL DATA:  Dyspnea EXAM: CHEST - 2 VIEW COMPARISON:  Chest x-ray performed May 29, 2016 FINDINGS: Airspace opacities are present which are annotated in the right upper lobe and lingula. The focus of opacity in the lingula is larger. Mild interstitial prominence is also present. Heart and mediastinum are not significantly changed. IMPRESSION: 1. Right upper lobe and lingular airspace opacities. The leading differential consideration is multifocal pneumonia. Correlation with clinical symptoms is recommended. At minimum, a follow-up chest x-ray should be considered to demonstrate resolution. Electronically Signed   By: Reagan Camera M.D.   On: 12/18/2023 08:21    Procedures Procedures    Medications Ordered in ED Medications  ketorolac  (TORADOL ) 15 MG/ML injection 15 mg (15 mg Intravenous Given 12/18/23 0913)  cefTRIAXone (ROCEPHIN) 2 g in sodium chloride  0.9 % 100 mL IVPB (0 g Intravenous Stopped 12/18/23 0948)  azithromycin (ZITHROMAX) tablet 500 mg (500 mg Oral Given 12/18/23 0913)  acetaminophen  (TYLENOL ) tablet 650 mg (650 mg Oral Given 12/18/23 1007)  iohexol (OMNIPAQUE) 350 MG/ML injection 75 mL (75 mLs Intravenous Contrast Given 12/18/23 1019)    ED Course/ Medical Decision Making/ A&P Clinical Course as of 12/18/23 1105  Sat Dec 18, 2023  0846 D-dimer, quantitative(!) D-dimer slightly increased to 0.73 [JK]  0929  CBC(!) Leukocytosis noted.  Troponin normal.  Metabolic panel normal [JK]    Clinical Course User Index [JK] Trish Furl, MD                                 Medical Decision Making Differential diagnosis includes but not limited to pneumonia pneumothorax pulmonary embolism  Problems Addressed: Multifocal pneumonia: acute illness or injury that poses a threat to life or bodily functions  Amount and/or Complexity of Data Reviewed Labs: ordered. Decision-making details documented in ED Course. Radiology: ordered and independent interpretation performed.  Risk OTC drugs. Prescription drug management. Decision regarding hospitalization.   Patient presented to ED with complaints of pleuritic chest pain.  Patient's evaluation  notable for leukocytosis.  Patient's x-ray also suggested pneumonia.  D-dimer slightly elevated and with her multifocal pneumonia findings CT scan was performed to evaluate for possible PE, mass.  CT angio was negative for PE but did show multifocal pneumonia.  I was concerned about these extensive findings.  Patient has history of substance use disorder but denies any use for several years.  I recommended admission to the hospital for IV antibiotic, close monitoring and further evaluation because of the extensive nature of the pneumonia.  I relayed my concerns to the patient of her symptoms worsening and becoming more severe..  Patient had a discussion with her partner.  She states she has children at home that she needs to take care of.  She does not want to be admitted to the hospital.  I will prescribe antibiotics for her to continue.  I explained the importance of close follow-up with her doctor next week.  She may need to see a pulmonary doctor.  Patient should return to hospital immediately if she has worsening symptoms over the weekend.       Final Clinical Impression(s) / ED Diagnoses Final diagnoses:  Multifocal pneumonia    Rx / DC Orders ED Discharge  Orders          Ordered    amoxicillin -clavulanate (AUGMENTIN) 875-125 MG tablet  Every 12 hours        12/18/23 1101    azithromycin (ZITHROMAX) 250 MG tablet  Daily        12/18/23 1101              Trish Furl, MD 12/18/23 1105
